# Patient Record
Sex: Female | Born: 1995 | Race: Black or African American | Hispanic: No | Marital: Single | Smoking: Current every day smoker
Health system: Southern US, Community
[De-identification: ages and names within clinical notes are randomized; demographics above are authoritative.]

## PROBLEM LIST (undated history)

## (undated) DIAGNOSIS — F419 Anxiety disorder, unspecified: Secondary | ICD-10-CM

## (undated) DIAGNOSIS — F329 Major depressive disorder, single episode, unspecified: Secondary | ICD-10-CM

## (undated) DIAGNOSIS — F32A Depression, unspecified: Secondary | ICD-10-CM

## (undated) DIAGNOSIS — G43909 Migraine, unspecified, not intractable, without status migrainosus: Secondary | ICD-10-CM

## (undated) HISTORY — PX: TONSILLECTOMY: SUR1361

---
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
## 2015-01-07 ENCOUNTER — Ambulatory Visit (INDEPENDENT_AMBULATORY_CARE_PROVIDER_SITE_OTHER): Payer: Medicaid Other | Admitting: Family Medicine

## 2015-01-07 VITALS — BP 116/50 | HR 87 | Temp 98.5°F | Wt 275.8 lb

## 2015-01-07 DIAGNOSIS — Z3046 Encounter for surveillance of implantable subdermal contraceptive: Secondary | ICD-10-CM

## 2015-01-07 DIAGNOSIS — Z308 Encounter for other contraceptive management: Secondary | ICD-10-CM

## 2015-01-07 NOTE — Patient Instructions (Signed)
The effects of the nexplanon should wear off in a week or so. Please use condoms for any sexual activity starting today. Let us know if you need another type of birth control.  Your arm will be sore for a few days. You can take tylenol for this. Let us know if it starts to look infected.

## 2015-01-07 NOTE — Progress Notes (Signed)
Patient ID: Gloria Proctor, female   DOB: 06-07-1996, 19 y.o.   MRN: [id number] Patient is here for removal of her etonogestrel rod implant (Nexplanon). She would like it removed because of weight gain. Denies S/E to the implant.  An informed consent was taken prior to removal.  Risks of the procedure include: bleeding, infection, difficulty with removal, scarring and nerve damage. There may be bruising at the site of incision and down the arm.  Procedure Note:  Time out taken: 10:50am  Team: Dr Richarda BladeAdamo, Dr Lum BabeEniola, Dorisann Framesamika Martin  Name and DOB of patient confirmed.  Procedure: Progestin Implant Removal  Procedure confirmed by patient and team.  Side: right arm  Position correct for procedure.  The patient is place in the supine position. Asceptic conditions are maintained. The rod is located by palpation. The area is cleaned with antiseptic. 3 cc of 2% lidocaine without epinephrine is injected just underneath the end of the implant closest to the elbow. After firmly pressing down on the end of the implant closer to the axilla a 2-3 mm incision is made with a scalpel. The rod is pushed to the incision site and grasped with a mosquito forceps and gently removed. Blunt dissection was not needed.  The patient toleratee the procedure well. The 4 cm rod was removed in its entirety. The incision was dressed with a small adhesive bandage closure and a pressure dressing was applied. An alternate plan for contraception was discussed. The patient would like to use Condom for her contraception. She was given a detailed instruction sheet about her desired method of contraception.  Procedure performed by Dr Richarda BladeAdamo, supervised by Dr Lum BabeEniola.

## 2015-03-30 ENCOUNTER — Ambulatory Visit (INDEPENDENT_AMBULATORY_CARE_PROVIDER_SITE_OTHER): Payer: Medicaid Other | Admitting: Family Medicine

## 2015-03-30 VITALS — BP 125/74 | HR 96 | Temp 98.5°F | Ht 63.0 in | Wt 283.0 lb

## 2015-03-30 DIAGNOSIS — L02439 Carbuncle of limb, unspecified: Secondary | ICD-10-CM

## 2015-03-30 DIAGNOSIS — L732 Hidradenitis suppurativa: Secondary | ICD-10-CM

## 2015-03-30 MED ORDER — DOXYCYCLINE HYCLATE 100 MG PO CAPS: 100.0000 mg | ORAL_CAPSULE | Freq: Two times a day (BID) | ORAL | Status: DC

## 2015-03-30 NOTE — Progress Notes (Signed)
Patient ID: Gloria Proctor, female   DOB: 1996-02-29, 19 y.o.   MRN: [id number]   [facility] Yolande Jolly, MD Phone: [phone]  Subjective:   # Boils - Pt. Has a hx of hydradenitis with recent boils under her right arm. She was seen previously and placed on doxycycline for 10 days which she finished 2 weeks ago.  - She says that the boils were gone at the end of her 2 week period, but returned last week. - One has drained, but another continues to be tender and bothersome.  - She has not had fever, chills, nausea, vomiting, spreading erythema under her arm.  - The area was more tender last week but is improving on its own.  - She is avoiding occlusive clothing, and she has been trying to avoid deodorant, but has been using gel deodorant recently due to running out of spray deodorant.  - She has not been using warm compresses, just warm showers twice daily.  - She has not been on any other medications including antibiotics or pain medicine since her last course of antibiotics.    All relevant systems were reviewed and were negative unless otherwise noted in the HPI  Past Medical History Reviewed problem list.  Medications- reviewed and updated Current Outpatient Prescriptions  Medication Sig Dispense Refill  . acetaminophen (TYLENOL) 325 MG tablet Take 650 mg by mouth every 6 (six) hours as needed (for pain.).    Marland Kitchen ALPRAZolam (XANAX) 0.5 MG tablet Take 0.5 mg by mouth at bedtime as needed for anxiety.    Marland Kitchen aspirin EC 81 MG tablet Take 81 mg by mouth once.    . cyclobenzaprine (FLEXERIL) 10 MG tablet Take 1 tablet (10 mg total) by mouth 2 (two) times daily as needed for muscle spasms. 20 tablet 0  . doxycycline (VIBRAMYCIN) 100 MG capsule Take 1 capsule (100 mg total) by mouth 2 (two) times daily. 20 capsule 0  . etonogestrel (NEXPLANON) 68 MG IMPL implant 1 each by Subdermal route once.    Marland Kitchen HYDROcodone-acetaminophen (NORCO/VICODIN) 5-325 MG per tablet  Take 1 tablet by mouth every 6 (six) hours as needed for moderate pain. (Patient not taking: Reported on 09/08/2014) 15 tablet 0  . ibuprofen (ADVIL,MOTRIN) 800 MG tablet Take 1 tablet (800 mg total) by mouth 3 (three) times daily. 21 tablet 0  . ipratropium (ATROVENT) 0.06 % nasal spray Place 2 sprays into both nostrils 4 (four) times daily.    Marland Kitchen sulfamethoxazole-trimethoprim (BACTRIM DS,SEPTRA DS) 800-160 MG per tablet Take 1 tablet by mouth 2 (two) times daily. 20 tablet 0  . SUMAtriptan (IMITREX) 5 MG/ACT nasal spray Place 1 spray into the nose every 2 (two) hours as needed for migraine.     No current facility-administered medications for this visit.   Chief complaint-noted No additions to family history Social history- patient is a non smoker  Objective: BP 125/74 mmHg  Pulse 96  Temp(Src) 98.5 F (36.9 C) (Oral)  Ht 5\' 3"  (1.6 m)  Wt 283 lb (128.368 kg)  BMI 50.14 kg/m2 Gen: NAD, alert, cooperative with exam HEENT: NCAT, EOMI, PERRL Neck: FROM, supple CV: RRR, good S1/S2, no murmur Resp: CTABL, no wheezes, non-labored Abd: SNTND, BS present, no guarding or organomegaly Ext: No edema, warm, normal tone, moves UE/LE spontaneously Neuro: Alert and oriented, No gross deficits Skin: She has several carbuncles / faruncles under her right arm. One has drained already, one other is mildly tender, but does not feel  large enough to drain. Three separate palpable carbuncles. No erythema, or signs of spreading infection. No fluctuance, mostly hard / slightly tender areas.   Assessment/Plan:  # Carbuncles - Pt. With a small area of fluid collection / inflammation / infection under her right arm. Not quite large enough to I&D. No evidence of systemic infection at this time.   - Recommend repeat course of Doxycycline as this worked previously.  - Warm compresses to the area q 1-2 hours  - Pt. To return if she develops worsening redness or pain. May require I&D.  - No deodorant. Only spray  deodorant. Avoid occlusive clothing.  - Follow up with PCP regarding carbuncles.

## 2015-03-30 NOTE — Patient Instructions (Signed)
Abscess An abscess is an infected area that contains a collection of pus and debris.It can occur in almost any part of the body. An abscess is also known as a furuncle or boil. CAUSES  An abscess occurs when tissue gets infected. This can occur from blockage of oil or sweat glands, infection of hair follicles, or a minor injury to the skin. As the body tries to fight the infection, pus collects in the area and creates pressure under the skin. This pressure causes pain. People with weakened immune systems have difficulty fighting infections and get certain abscesses more often.  SYMPTOMS Usually an abscess develops on the skin and becomes a painful mass that is red, warm, and tender. If the abscess forms under the skin, you may feel a moveable soft area under the skin. Some abscesses break open (rupture) on their own, but most will continue to get worse without care. The infection can spread deeper into the body and eventually into the bloodstream, causing you to feel ill.  DIAGNOSIS  Your caregiver will take your medical history and perform a physical exam. A sample of fluid may also be taken from the abscess to determine what is causing your infection. TREATMENT  Your caregiver may prescribe antibiotic medicines to fight the infection. However, taking antibiotics alone usually does not cure an abscess. Your caregiver may need to make a small cut (incision) in the abscess to drain the pus. In some cases, gauze is packed into the abscess to reduce pain and to continue draining the area. HOME CARE INSTRUCTIONS   Only take over-the-counter or prescription medicines for pain, discomfort, or fever as directed by your caregiver.  If you were prescribed antibiotics, take them as directed. Finish them even if you start to feel better.  If gauze is used, follow your caregiver's directions for changing the gauze.  To avoid spreading the infection:  Keep your draining abscess covered with a  bandage.  Wash your hands well.  Do not share personal care items, towels, or whirlpools with others.  Avoid skin contact with others.  Keep your skin and clothes clean around the abscess.  Keep all follow-up appointments as directed by your caregiver. SEEK MEDICAL CARE IF:   You have increased pain, swelling, redness, fluid drainage, or bleeding.  You have muscle aches, chills, or a general ill feeling.  You have a fever. MAKE SURE YOU:   Understand these instructions.  Will watch your condition.  Will get help right away if you are not doing well or get worse. Document Released: 05/24/2005 Document Revised: 02/13/2012 Document Reviewed: 10/27/2011 [facility] Patient Information 2015 [place], [place]. This information is not intended to replace advice given to you by your health care provider. Make sure you discuss any questions you have with your health care provider.    Thanks for coming in today.   Place warm compresses under your arm every 1-2 hours for around 10 minutes. This will help to relieve the abscess.   Take ibuprofen over the counter for pain. I have sent in a script for Doxycycline. You will take one pill twice daily for 10 days.   Only use the spray deodorant, and do not wear shirts that cover up your arm pit / are restrictive. Sleeveless shirts will be best.   Follow up with your primary doctor about your periods and about the boils. If you have any worsening symptoms, fever, chills, nausea, vomiting, or worsening tenderness / redness then please return for further evaluation as  it might need to be opened up.   Thanks for letting us take care of you!  Sincerely,  Devota Pace, MD Family Medicine - PGY 2

## 2015-04-20 ENCOUNTER — Encounter: Payer: Self-pay | Admitting: Family Medicine

## 2015-04-20 ENCOUNTER — Ambulatory Visit (INDEPENDENT_AMBULATORY_CARE_PROVIDER_SITE_OTHER): Payer: Medicaid Other | Admitting: Family Medicine

## 2015-04-20 VITALS — BP 128/77 | HR 80 | Temp 98.2°F | Ht 63.0 in | Wt 285.2 lb

## 2015-04-20 DIAGNOSIS — L732 Hidradenitis suppurativa: Secondary | ICD-10-CM

## 2015-04-20 NOTE — Assessment & Plan Note (Signed)
States she has never heard this diagnosis before.  Discussed with her.  Treat today with Doxycycline for concerns of surrounding area of cellulitis.  These have self-expressed.  FU as needed.  Handout on hidradenitis given.

## 2015-04-20 NOTE — Patient Instructions (Signed)
Let's try the Doxycycline again to help with this.    Take it twice a day for the next 10 days.    Let me know if you have any problems with this.   Hidradenitis Suppurativa, Sweat Gland Abscess Hidradenitis suppurativa is a long lasting (chronic), uncommon disease of the sweat glands. With this, boil-like lumps and scarring develop in the groin, some times under the arms (axillae), and under the breasts. It may also uncommonly occur behind the ears, in the crease of the buttocks, and around the genitals.  CAUSES  The cause is from a blocking of the sweat glands. They then become infected. It may cause drainage and odor. It is not contagious. So it cannot be given to someone else. It most often shows up in puberty (about 77 to 19 years of age). But it may happen much later. It is similar to acne which is a disease of the sweat glands. This condition is slightly more common in African-Americans and women. SYMPTOMS   Hidradenitis usually starts as one or more red, tender, swellings in the groin or under the arms (axilla).  Over a period of hours to days the lesions get larger. They often open to the skin surface, draining clear to yellow-colored fluid.  The infected area heals with scarring. DIAGNOSIS  Your caregiver makes this diagnosis by looking at you. Sometimes cultures (growing germs on plates in the lab) may be taken. This is to see what germ (bacterium) is causing the infection.  TREATMENT   Topical germ killing medicine applied to the skin (antibiotics) are the treatment of choice. Antibiotics taken by mouth (systemic) are sometimes needed when the condition is getting worse or is severe.  Avoid tight-fitting clothing which traps moisture in.  Dirt does not cause hidradenitis and it is not caused by poor hygiene.  Involved areas should be cleaned daily using an antibacterial soap. Some patients find that the liquid form of Lever 2000, applied to the involved areas as a lotion after  bathing, can help reduce the odor related to this condition.  Sometimes surgery is needed to drain infected areas or remove scarred tissue. Removal of large amounts of tissue is used only in severe cases.  Birth control pills may be helpful.  Oral retinoids (vitamin A derivatives) for 6 to 12 months which are effective for acne may also help this condition.  Weight loss will improve but not cure hidradenitis. It is made worse by being overweight. But the condition is not caused by being overweight.  This condition is more common in people who have had acne.  It may become worse under stress. There is no medical cure for hidradenitis. It can be controlled, but not cured. The condition usually continues for years with periods of getting worse and getting better (remission). Document Released: 03/28/2004 Document Revised: 11/06/2011 Document Reviewed: 11/14/2013 [facility] Patient Information 2015 [place], [place]. This information is not intended to replace advice given to you by your health care provider. Make sure you discuss any questions you have with your health care provider.

## 2015-04-20 NOTE — Progress Notes (Signed)
Subjective:    Gloria Proctor is a 19 y.o. female who presents to [facility] today for boils:  1.  Boils:  Has had several boils appear on bilateral breasts in past month or so.  Has had prior abscesses in axilla and groin.  Never breast.  She is worried that it is b/c her breasts are too big.  They have all currently expressed themselves.  No fevers or chills.  No N/V.  Eating and drinking well.   ROS as above per HPI, otherwise neg.    The following portions of the patient's history were reviewed and updated as appropriate: allergies, current medications, past medical history, family and social history, and problem list. Patient is a nonsmoker.    PMH reviewed.  Past Medical History  Diagnosis Date  . Migraines    Past Surgical History  Procedure Laterality Date  . Tonsillectomy and adenoidectomy N/A 08/16/2013    Procedure: TONSILLECTOMY AND ADENOIDECTOMY;  Surgeon: Flo Shanks, MD;  Location: [facility];  Service: ENT;  Laterality: N/A;    Medications reviewed. Current Outpatient Prescriptions  Medication Sig Dispense Refill  . acetaminophen (TYLENOL) 325 MG tablet Take 650 mg by mouth every 6 (six) hours as needed (for pain.).    Marland Kitchen ALPRAZolam (XANAX) 0.5 MG tablet Take 0.5 mg by mouth at bedtime as needed for anxiety.    Marland Kitchen aspirin EC 81 MG tablet Take 81 mg by mouth once.    . cyclobenzaprine (FLEXERIL) 10 MG tablet Take 1 tablet (10 mg total) by mouth 2 (two) times daily as needed for muscle spasms. 20 tablet 0  . doxycycline (VIBRAMYCIN) 100 MG capsule Take 1 capsule (100 mg total) by mouth 2 (two) times daily. 20 capsule 0  . etonogestrel (NEXPLANON) 68 MG IMPL implant 1 each by Subdermal route once.    Marland Kitchen HYDROcodone-acetaminophen (NORCO/VICODIN) 5-325 MG per tablet Take 1 tablet by mouth every 6 (six) hours as needed for moderate pain. (Patient not taking: Reported on 09/08/2014) 15 tablet 0  . ibuprofen (ADVIL,MOTRIN) 800 MG tablet Take 1 tablet (800 mg total) by mouth 3 (three)  times daily. 21 tablet 0  . ipratropium (ATROVENT) 0.06 % nasal spray Place 2 sprays into both nostrils 4 (four) times daily.    Marland Kitchen sulfamethoxazole-trimethoprim (BACTRIM DS,SEPTRA DS) 800-160 MG per tablet Take 1 tablet by mouth 2 (two) times daily. 20 tablet 0  . SUMAtriptan (IMITREX) 5 MG/ACT nasal spray Place 1 spray into the nose every 2 (two) hours as needed for migraine.     No current facility-administered medications for this visit.     Objective:   Physical Exam BP 128/77 mmHg  Pulse 80  Temp(Src) 98.2 F (36.8 C) (Oral)  Ht  (1.6 m)  Wt 285 lb 3.2 oz (129.366 kg)  BMI 50.53 kg/m2 Gen:  Alert, cooperative patient who appears stated age in no acute distress.  Vital signs reviewed. Skin:  3 expressed abscess on Right breast, 2 on left.  No surrounding areas of erythema on Right, does have erythema on both left boils.  No induration.  No fluctuance.   No results found for this or any previous visit (from the past 72 hour(s)).

## 2015-05-17 NOTE — Pre-Procedure Instructions (Signed)
BMI > 50, will need to be done at [facility]; Sam at Dr. Randa Evens office notified.

## 2015-05-18 ENCOUNTER — Encounter: Payer: Self-pay | Admitting: Family Medicine

## 2015-05-18 ENCOUNTER — Encounter (HOSPITAL_BASED_OUTPATIENT_CLINIC_OR_DEPARTMENT_OTHER): Payer: Self-pay

## 2015-05-18 ENCOUNTER — Ambulatory Visit (HOSPITAL_BASED_OUTPATIENT_CLINIC_OR_DEPARTMENT_OTHER): Admit: 2015-05-18 | Payer: Medicaid Other | Admitting: Oral Surgery

## 2015-05-18 ENCOUNTER — Ambulatory Visit (INDEPENDENT_AMBULATORY_CARE_PROVIDER_SITE_OTHER): Payer: Medicaid Other | Admitting: Family Medicine

## 2015-05-18 VITALS — BP 140/84 | HR 72 | Temp 98.1°F | Wt 283.0 lb

## 2015-05-18 DIAGNOSIS — R109 Unspecified abdominal pain: Secondary | ICD-10-CM | POA: Diagnosis not present

## 2015-05-18 LAB — POCT URINE PREGNANCY: Preg Test, Ur: NEGATIVE

## 2015-05-18 SURGERY — EXTRACTION, TOOTH, MOLAR
Anesthesia: General | Site: Mouth

## 2015-05-18 NOTE — Progress Notes (Signed)
  HPI:  Pt presents for a same day appointment to discuss needing work note.  Patient called out of work yesterday due to having stomach pains, headache, and feeling fatigued. Now feels much better and is ready to go back to work. No vomiting or fever. Headache resolved. Has had abdominal pain about every other day for the last week. Occasionally has had diarrhea but no blood in stool. No vaginal discharge or pelvic pain. Sexually active with one female partner in the last year. Abdominal pain is not worsening. LMP was 2 years ago, recently got nexplanon out in May due to weight gain. Does not use condoms regularly. Pain is located in epigastric area. Cramping-like in quality.  ROS: See HPI  PMFSH: history of anx/depressed mood, hidradenitis suppuritiva, migraine, obesity  PHYSICAL EXAM: BP 140/84 mmHg  Pulse 72  Temp(Src) 98.1 F (36.7 C) (Oral)  Wt 283 lb (128.368 kg) Gen: NAD, pleasant, cooperative HEENT: NCAT Heart: regular rate and rhythm, no murmur Lungs: clear to auscultation bilaterally, NWOB Abdomen: soft, nontender to palpation. No masses or organomegaly Neuro: grossly nonfocal, speech normal  ASSESSMENT/PLAN:  1. Abdominal pain - occuring every other day for the past week, with some diarrhea. Benign abdominal exam today. Urine pregnancy test negative. May represent mild viral gastroenteritis vs gastritis. No red flags. Recommend trial of OTC tums or zantac, observation. Return if not improving. Note given saying she can return to work.  2. Contraception management - encouraged patient to consider additional forms of birth control since no longer has nexplanon. Gave handout on [facility] options.  FOLLOW UP: F/u as needed if symptoms worsen or do not improve.   [place] J. Pollie Meyer, MD [facility]

## 2015-05-18 NOTE — Patient Instructions (Signed)
Give this a little more time to see if it gets better Okay to try over the counter tums or zantac  Return if not improving See handout below on birth control options  Be well, Dr. Pollie Meyer   Contraception Choices Contraception (birth control) is the use of any methods or devices to prevent pregnancy. Below are some methods to help avoid pregnancy. HORMONAL METHODS   Contraceptive implant. This is a thin, plastic tube containing progesterone hormone. It does not contain estrogen hormone. Your health care Maja Mccaffery inserts the tube in the inner part of the upper arm. The tube can remain in place for up to 3 years. After 3 years, the implant must be removed. The implant prevents the ovaries from releasing an egg (ovulation), thickens the cervical mucus to prevent sperm from entering the uterus, and thins the lining of the inside of the uterus.  Progesterone-only injections. These injections are given every 3 months by your health care Maicey Barrientez to prevent pregnancy. This synthetic progesterone hormone stops the ovaries from releasing eggs. It also thickens cervical mucus and changes the uterine lining. This makes it harder for sperm to survive in the uterus.  Birth control pills. These pills contain estrogen and progesterone hormone. They work by preventing the ovaries from releasing eggs (ovulation). They also cause the cervical mucus to thicken, preventing the sperm from entering the uterus. Birth control pills are prescribed by a health care Sergei Delo.Birth control pills can also be used to treat heavy periods.  Minipill. This type of birth control pill contains only the progesterone hormone. They are taken every day of each month and must be prescribed by your health care Aveen Stansel.  Birth control patch. The patch contains hormones similar to those in birth control pills. It must be changed once a week and is prescribed by a health care Lonnetta Kniskern.  Vaginal ring. The ring contains hormones similar  to those in birth control pills. It is left in the vagina for 3 weeks, removed for 1 week, and then a new one is put back in place. The patient must be comfortable inserting and removing the ring from the vagina.A health care Shena Vinluan's prescription is necessary.  Emergency contraception. Emergency contraceptives prevent pregnancy after unprotected sexual intercourse. This pill can be taken right after sex or up to 5 days after unprotected sex. It is most effective the sooner you take the pills after having sexual intercourse. Most emergency contraceptive pills are available without a prescription. Check with your pharmacist. Do not use emergency contraception as your only form of birth control. BARRIER METHODS   Female condom. This is a thin sheath (latex or rubber) that is worn over the penis during sexual intercourse. It can be used with spermicide to increase effectiveness.  Female condom. This is a soft, loose-fitting sheath that is put into the vagina before sexual intercourse.  Diaphragm. This is a soft, latex, dome-shaped barrier that must be fitted by a health care Dorea Duff. It is inserted into the vagina, along with a spermicidal jelly. It is inserted before intercourse. The diaphragm should be left in the vagina for 6 to 8 hours after intercourse.  Cervical cap. This is a round, soft, latex or plastic cup that fits over the cervix and must be fitted by a health care Delaine Canter. The cap can be left in place for up to 48 hours after intercourse.  Sponge. This is a soft, circular piece of polyurethane foam. The sponge has spermicide in it. It is inserted into the vagina  after wetting it and before sexual intercourse.  Spermicides. These are chemicals that kill or block sperm from entering the cervix and uterus. They come in the form of creams, jellies, suppositories, foam, or tablets. They do not require a prescription. They are inserted into the vagina with an applicator before having sexual  intercourse. The process must be repeated every time you have sexual intercourse. INTRAUTERINE CONTRACEPTION  Intrauterine device (IUD). This is a T-shaped device that is put in a woman's uterus during a menstrual period to prevent pregnancy. There are 2 types:  Copper IUD. This type of IUD is wrapped in copper wire and is placed inside the uterus. Copper makes the uterus and fallopian tubes produce a fluid that kills sperm. It can stay in place for 10 years.  Hormone IUD. This type of IUD contains the hormone progestin (synthetic progesterone). The hormone thickens the cervical mucus and prevents sperm from entering the uterus, and it also thins the uterine lining to prevent implantation of a fertilized egg. The hormone can weaken or kill the sperm that get into the uterus. It can stay in place for 3-5 years, depending on which type of IUD is used. PERMANENT METHODS OF CONTRACEPTION  Female tubal ligation. This is when the woman's fallopian tubes are surgically sealed, tied, or blocked to prevent the egg from traveling to the uterus.  Hysteroscopic sterilization. This involves placing a small coil or insert into each fallopian tube. Your doctor uses a technique called hysteroscopy to do the procedure. The device causes scar tissue to form. This results in permanent blockage of the fallopian tubes, so the sperm cannot fertilize the egg. It takes about 3 months after the procedure for the tubes to become blocked. You must use another form of birth control for these 3 months.  Female sterilization. This is when the female has the tubes that carry sperm tied off (vasectomy).This blocks sperm from entering the vagina during sexual intercourse. After the procedure, the man can still ejaculate fluid (semen). NATURAL PLANNING METHODS  Natural family planning. This is not having sexual intercourse or using a barrier method (condom, diaphragm, cervical cap) on days the woman could become pregnant.  Calendar  method. This is keeping track of the length of each menstrual cycle and identifying when you are fertile.  Ovulation method. This is avoiding sexual intercourse during ovulation.  Symptothermal method. This is avoiding sexual intercourse during ovulation, using a thermometer and ovulation symptoms.  Post-ovulation method. This is timing sexual intercourse after you have ovulated. Regardless of which type or method of contraception you choose, it is important that you use condoms to protect against the transmission of sexually transmitted infections (STIs). Talk with your health care [employer] about which form of contraception is most appropriate for you. Document Released: 08/14/2005 Document Revised: 08/19/2013 Document Reviewed: 02/06/2013 [facility] Patient Information 2015 [place], [place]. This information is not intended to replace advice given to you by your health care Trayon Krantz. Make sure you discuss any questions you have with your health care Mandie Crabbe.

## 2015-05-27 ENCOUNTER — Encounter: Payer: Self-pay | Admitting: Family Medicine

## 2015-05-27 ENCOUNTER — Ambulatory Visit (INDEPENDENT_AMBULATORY_CARE_PROVIDER_SITE_OTHER): Payer: Medicaid Other | Admitting: Family Medicine

## 2015-05-27 VITALS — BP 129/81 | HR 100 | Temp 98.6°F | Ht 63.0 in | Wt 284.0 lb

## 2015-05-27 DIAGNOSIS — G43809 Other migraine, not intractable, without status migrainosus: Secondary | ICD-10-CM

## 2015-05-27 MED ORDER — IBUPROFEN 600 MG PO TABS: ORAL_TABLET | ORAL | Status: DC

## 2015-05-27 NOTE — Patient Instructions (Signed)
Thank you for coming to see me today. It was a pleasure. Today we talked about:   Migraine: I will discontinue your Imitrex since you do not like this medication. I will prescribe Ibuprofen  for you to take during migraines. Do not take this for more than 3 days. Do not take this if you become pregnant.  Please make an appointment to see Dr. Caroleen Hamman for follow-up  If you have any questions or concerns, please do not hesitate to call the office at [phone].  Sincerely,  Jacquelin Hawking, MD  Migraine Headache A migraine headache is an intense, throbbing pain on one or both sides of your head. A migraine can last for 30 minutes to several hours. CAUSES  The exact cause of a migraine headache is not always known. However, a migraine may be caused when nerves in the brain become irritated and release chemicals that cause inflammation. This causes pain. Certain things may also trigger migraines, such as:  Alcohol.  Smoking.  Stress.  Menstruation.  Aged cheeses.  Foods or drinks that contain nitrates, glutamate, aspartame, or tyramine.  Lack of sleep.  Chocolate.  Caffeine.  Hunger.  Physical exertion.  Fatigue.  Medicines used to treat chest pain (nitroglycerine), birth control pills, estrogen, and some blood pressure medicines. SIGNS AND SYMPTOMS  Pain on one or both sides of your head.  Pulsating or throbbing pain.  Severe pain that prevents daily activities.  Pain that is aggravated by any physical activity.  Nausea, vomiting, or both.  Dizziness.  Pain with exposure to bright lights, loud noises, or activity.  General sensitivity to bright lights, loud noises, or smells. Before you get a migraine, you may get warning signs that a migraine is coming (aura). An aura may include:  Seeing flashing lights.  Seeing bright spots, halos, or zigzag lines.  Having tunnel vision or blurred vision.  Having feelings of numbness or tingling.  Having  trouble talking.  Having muscle weakness. DIAGNOSIS  A migraine headache is often diagnosed based on:  Symptoms.  Physical exam.  A CT scan or MRI of your head. These imaging tests cannot diagnose migraines, but they can help rule out other causes of headaches. TREATMENT Medicines may be given for pain and nausea. Medicines can also be given to help prevent recurrent migraines.  HOME CARE INSTRUCTIONS  Only take over-the-counter or prescription medicines for pain or discomfort as directed by your health care provider. The use of long-term narcotics is not recommended.  Lie down in a dark, quiet room when you have a migraine.  Keep a journal to find out what may trigger your migraine headaches. For example, write down:  What you eat and drink.  How much sleep you get.  Any change to your diet or medicines.  Limit alcohol consumption.  Quit smoking if you smoke.  Get 7-9 hours of sleep, or as recommended by your health care provider.  Limit stress.  Keep lights dim if bright lights bother you and make your migraines worse. SEEK IMMEDIATE MEDICAL CARE IF:   Your migraine becomes severe.  You have a fever.  You have a stiff neck.  You have vision loss.  You have muscular weakness or loss of muscle control.  You start losing your balance or have trouble walking.  You feel faint or pass out.  You have severe symptoms that are different from your first symptoms. MAKE SURE YOU:   Understand these instructions.  Will watch your condition.  Will get  help right away if you are not doing well or get worse. Document Released: 08/14/2005 Document Revised: 12/29/2013 Document Reviewed: 04/21/2013 [facility] Patient Information 2015 [place]. This information is not intended to replace advice given to you by your health care provider. Make sure you discuss any questions you have with your health care provider.

## 2015-05-27 NOTE — Progress Notes (Signed)
    Subjective   Gloria Proctor is a 19 y.o. female that presents for a same day visit  1. Migraines: Patient states she had a bad migraine yesterday. She had to take Imitrex which helps, but makes her nauseated and causes her to vomit. These symptoms usually resolve quickly with no further issues. She did not use any ibuprofen for her headache. She is having migraines very infrequently.   ROS Per HPI  Social History  Substance Use Topics  . Smoking status: Former Smoker    Types: Cigarettes    Start date: 03/28/2014    Quit date: 02/18/2015  . Smokeless tobacco: Current User  . Alcohol Use: No    Allergies  Allergen Reactions  . Caffeine Other (See Comments)    Patient has migraines   . Chocolate Other (See Comments)    Reaction:migraines    Objective   BP 129/81 mmHg  Pulse 100  Temp(Src) 98.6 F (37 C) (Oral)  Ht  (1.6 m)  Wt 284 lb (128.822 kg)  BMI 50.32 kg/m2  General: Well appearing, no distress  Assessment and Plan   Meds ordered this encounter  Medications  . ibuprofen (ADVIL,MOTRIN) 600 MG tablet    Sig: Take 1 pill every 3 hours as needed for migraine for up to 3 days    Dispense:  30 tablet    Refill:  0    Migraine: currently asymptomatic after using Imitrex. Patient having very infrequent symptoms. Does not like side effects of Imitrex  Ibuprofen  q8hrs prn for headache/migraine  Follow-up with PCP  Note for work

## 2015-06-01 ENCOUNTER — Encounter (HOSPITAL_COMMUNITY): Payer: Self-pay

## 2015-06-01 ENCOUNTER — Encounter (HOSPITAL_COMMUNITY)
Admission: RE | Admit: 2015-06-01 | Discharge: 2015-06-01 | Disposition: A | Discharge status: Home / Self Care | Payer: Medicaid Other | Source: Ambulatory Visit | Attending: Oral Surgery | Admitting: Oral Surgery

## 2015-06-01 DIAGNOSIS — Z01812 Encounter for preprocedural laboratory examination: Secondary | ICD-10-CM | POA: Diagnosis present

## 2015-06-01 HISTORY — DX: Depression, unspecified: F32.A

## 2015-06-01 HISTORY — DX: Major depressive disorder, single episode, unspecified: F32.9

## 2015-06-01 HISTORY — DX: Anxiety disorder, unspecified: F41.9

## 2015-06-01 LAB — CBC
HEMATOCRIT: 39.2 % (ref 36.0–46.0)
Hemoglobin: 13 g/dL (ref 12.0–15.0)
MCH: 28.3 pg (ref 26.0–34.0)
MCHC: 33.2 g/dL (ref 30.0–36.0)
MCV: 85.2 fL (ref 78.0–100.0)
PLATELETS: 339 10*3/uL (ref 150–400)
RBC: 4.6 MIL/uL (ref 3.87–5.11)
RDW: 13.4 % (ref 11.5–15.5)
WBC: 6.4 10*3/uL (ref 4.0–10.5)

## 2015-06-01 LAB — HCG, SERUM, QUALITATIVE: Preg, Serum: NEGATIVE

## 2015-06-01 NOTE — Progress Notes (Signed)
PCP - [facility] Cardiologist - denies  EKG- denies CXR- denies  Echo/stress test/cardiac cath - pt. Denies  Patient denies chest pain and shortness of breath at PAT appointment.

## 2015-06-01 NOTE — Pre-Procedure Instructions (Signed)
    KATIEANN HUNGATE  06/01/2015      [id number] [place], [place] - [address] AT [facility] [address] [place] [address] Phone: [phone] Fax: [phone]    Your procedure is scheduled on Friday, October 7th, 2016.Marland Kitchen  Report to [facility] Admitting at 8:30 A.M.  Call this number if you have problems the morning of surgery:  [phone]   Remember:  Do not eat food or drink liquids after midnight.   Take these medicines the morning of surgery with A SIP OF WATER: None  Stop taking: Ibuprofen, NSAIDS, Aspirin, Aleve, Naproxen, Advil, Motrin, all herbal medications, and all vitamins.    Do not wear jewelry, make-up or nail polish.  Do not wear lotions, powders, or perfumes.  You may wear deodorant.  Do not shave 48 hours prior to surgery.    Do not bring valuables to the hospital.  [facility] is not responsible for any belongings or valuables.  Contacts, dentures or bridgework may not be worn into surgery.  Leave your suitcase in the car.  After surgery it may be brought to your room.  For patients admitted to the hospital, discharge time will be determined by your treatment team.  Patients discharged the day of surgery will not be allowed to drive home.   Special instructions:  See attached.   Please read over the following fact sheets that you were given. Pain Booklet, Coughing and Deep Breathing and Surgical Site Infection Prevention

## 2015-06-02 NOTE — H&P (Signed)
HISTORY AND PHYSICAL  Gloria Proctor is a 19 y.o. female patient with CC: painful wisdom teeth.  No diagnosis found.  Past Medical History  Diagnosis Date  . Migraines   . Anxiety   . Depression     No current facility-administered medications for this encounter.   Current Outpatient Prescriptions  Medication Sig Dispense Refill  . acetaminophen (TYLENOL) 325 MG tablet Take 650 mg by mouth every 6 (six) hours as needed (for pain.).    Marland Kitchen ALPRAZolam (XANAX) 0.5 MG tablet Take 0.5 mg by mouth at bedtime as needed for anxiety.    Marland Kitchen aspirin EC 81 MG tablet Take 81 mg by mouth once.    . fluticasone (FLONASE) 50 MCG/ACT nasal spray Place 1 spray into both nostrils daily.    Marland Kitchen ibuprofen (ADVIL,MOTRIN) 600 MG tablet Take 1 pill every 3 hours as needed for migraine for up to 3 days 30 tablet 0  . ipratropium (ATROVENT) 0.06 % nasal spray Place 2 sprays into both nostrils 4 (four) times daily.    . cyclobenzaprine (FLEXERIL) 10 MG tablet Take 1 tablet (10 mg total) by mouth 2 (two) times daily as needed for muscle spasms. (Patient not taking: Reported on 05/31/2015) 20 tablet 0  . doxycycline (VIBRAMYCIN) 100 MG capsule Take 1 capsule (100 mg total) by mouth 2 (two) times daily. (Patient not taking: Reported on 05/31/2015) 20 capsule 0  . sulfamethoxazole-trimethoprim (BACTRIM DS,SEPTRA DS) 800-160 MG per tablet Take 1 tablet by mouth 2 (two) times daily. (Patient not taking: Reported on 05/31/2015) 20 tablet 0   Allergies  Allergen Reactions  . Caffeine Other (See Comments)    Patient has migraines   . Chocolate Other (See Comments)    Reaction:migraines   Active Problems:   * No active hospital problems. *  Vitals: There were no vitals taken for this visit. Lab results: Results for orders placed or performed during the hospital encounter of 06/01/15 (from the past 24 hour(s))  hCG, serum, qualitative     Status: None   Collection Time: 06/01/15 12:41 PM  Result Value Ref Range   Preg, Serum NEGATIVE NEGATIVE  CBC     Status: None   Collection Time: 06/01/15 12:41 PM  Result Value Ref Range   WBC 6.4 4.0 - 10.5 K/uL   RBC 4.60 3.87 - 5.11 MIL/uL   Hemoglobin 13.0 12.0 - 15.0 g/dL   HCT 16.1 09.6 - 04.5 %   MCV 85.2 78.0 - 100.0 fL   MCH 28.3 26.0 - 34.0 pg   MCHC 33.2 30.0 - 36.0 g/dL   RDW 40.9 81.1 - 91.4 %   Platelets 339 150 - 400 K/uL   Radiology Results: No results found. General appearance: alert, cooperative, no distress and morbidly obese Head: Normocephalic, without obvious abnormality, atraumatic Eyes: negative Nose: Nares normal. Septum midline. Mucosa normal. No drainage or sinus tenderness. Throat: lips, mucosa, and tongue normal; teeth and gums normal and Impacted teeth # 1, 16, 17, 32. Pharynx clear. No trismus. Neck: no adenopathy, supple, symmetrical, trachea midline and thyroid not enlarged, symmetric, no tenderness/mass/nodules Resp: clear to auscultation bilaterally Cardio: regular rate and rhythm, S1, S2 normal, no murmur, click, rub or gallop  Assessment:Impacted teeth 1, 16, 17, 32  Plan: Dental extractions impacted wisdom teeth. General anesthesia. Day surgery.   Bronda Alfred M 06/02/2015

## 2015-06-03 MED ORDER — DEXTROSE 5 % IV SOLN
3.0000 g | INTRAVENOUS | Status: AC
  Administered 2015-06-04: 3 g via INTRAVENOUS
  Filled 2015-06-03: qty 3000

## 2015-06-04 ENCOUNTER — Ambulatory Visit (HOSPITAL_COMMUNITY): Payer: Medicaid Other | Admitting: Certified Registered Nurse Anesthetist

## 2015-06-04 ENCOUNTER — Ambulatory Visit (HOSPITAL_COMMUNITY)
Admission: RE | Admit: 2015-06-04 | Discharge: 2015-06-04 | Disposition: A | Discharge status: Home / Self Care | Payer: Medicaid Other | Source: Ambulatory Visit | Attending: Oral Surgery | Admitting: Oral Surgery

## 2015-06-04 ENCOUNTER — Encounter (HOSPITAL_COMMUNITY)
Admission: RE | Disposition: A | Discharge status: Home / Self Care | Payer: Self-pay | Source: Ambulatory Visit | Attending: Oral Surgery

## 2015-06-04 ENCOUNTER — Encounter (HOSPITAL_COMMUNITY): Payer: Self-pay | Admitting: *Deleted

## 2015-06-04 DIAGNOSIS — F419 Anxiety disorder, unspecified: Secondary | ICD-10-CM | POA: Diagnosis not present

## 2015-06-04 DIAGNOSIS — Z888 Allergy status to other drugs, medicaments and biological substances status: Secondary | ICD-10-CM | POA: Diagnosis not present

## 2015-06-04 DIAGNOSIS — Z87891 Personal history of nicotine dependence: Secondary | ICD-10-CM | POA: Insufficient documentation

## 2015-06-04 DIAGNOSIS — Z6841 Body Mass Index (BMI) 40.0 and over, adult: Secondary | ICD-10-CM | POA: Diagnosis not present

## 2015-06-04 DIAGNOSIS — Z7982 Long term (current) use of aspirin: Secondary | ICD-10-CM | POA: Diagnosis not present

## 2015-06-04 DIAGNOSIS — G43909 Migraine, unspecified, not intractable, without status migrainosus: Secondary | ICD-10-CM | POA: Insufficient documentation

## 2015-06-04 DIAGNOSIS — K011 Impacted teeth: Secondary | ICD-10-CM | POA: Insufficient documentation

## 2015-06-04 DIAGNOSIS — Z91018 Allergy to other foods: Secondary | ICD-10-CM | POA: Insufficient documentation

## 2015-06-04 DIAGNOSIS — F329 Major depressive disorder, single episode, unspecified: Secondary | ICD-10-CM | POA: Diagnosis not present

## 2015-06-04 HISTORY — PX: MULTIPLE EXTRACTIONS WITH ALVEOLOPLASTY: SHX5342

## 2015-06-04 SURGERY — MULTIPLE EXTRACTION WITH ALVEOLOPLASTY
Anesthesia: General | Site: Mouth

## 2015-06-04 MED ORDER — LIDOCAINE HCL (CARDIAC) 20 MG/ML IV SOLN
INTRAVENOUS | Status: DC | PRN
  Administered 2015-06-04: 100 mg via INTRAVENOUS

## 2015-06-04 MED ORDER — MIDAZOLAM HCL 5 MG/5ML IJ SOLN
INTRAMUSCULAR | Status: DC | PRN
  Administered 2015-06-04: 2 mg via INTRAVENOUS

## 2015-06-04 MED ORDER — ONDANSETRON HCL 4 MG/2ML IJ SOLN
INTRAMUSCULAR | Status: DC | PRN
  Administered 2015-06-04: 4 mg via INTRAVENOUS

## 2015-06-04 MED ORDER — FENTANYL CITRATE (PF) 100 MCG/2ML IJ SOLN
INTRAMUSCULAR | Status: DC | PRN
  Administered 2015-06-04: 150 ug via INTRAVENOUS
  Administered 2015-06-04: 50 ug via INTRAVENOUS

## 2015-06-04 MED ORDER — PROPOFOL 10 MG/ML IV BOLUS
INTRAVENOUS | Status: DC | PRN
  Administered 2015-06-04: 125 mg via INTRAVENOUS
  Administered 2015-06-04: 35 mg via INTRAVENOUS

## 2015-06-04 MED ORDER — PROPOFOL 10 MG/ML IV BOLUS
INTRAVENOUS | Status: AC
Start: 2015-06-04 — End: 2015-06-04
  Filled 2015-06-04: qty 20

## 2015-06-04 MED ORDER — SUCCINYLCHOLINE CHLORIDE 20 MG/ML IJ SOLN
INTRAMUSCULAR | Status: AC
  Filled 2015-06-04: qty 1

## 2015-06-04 MED ORDER — OXYCODONE-ACETAMINOPHEN 5-325 MG PO TABS: 1.0000 | ORAL_TABLET | ORAL | Status: DC | PRN

## 2015-06-04 MED ORDER — 0.9 % SODIUM CHLORIDE (POUR BTL) OPTIME
TOPICAL | Status: DC | PRN
  Administered 2015-06-04: 1000 mL

## 2015-06-04 MED ORDER — LIDOCAINE HCL (CARDIAC) 20 MG/ML IV SOLN
INTRAVENOUS | Status: AC
  Filled 2015-06-04: qty 5

## 2015-06-04 MED ORDER — ONDANSETRON HCL 4 MG/2ML IJ SOLN
INTRAMUSCULAR | Status: AC
  Filled 2015-06-04: qty 2

## 2015-06-04 MED ORDER — FENTANYL CITRATE (PF) 250 MCG/5ML IJ SOLN
INTRAMUSCULAR | Status: AC
  Filled 2015-06-04: qty 5

## 2015-06-04 MED ORDER — OXYMETAZOLINE HCL 0.05 % NA SOLN
NASAL | Status: DC | PRN
  Administered 2015-06-04: 1 via NASAL

## 2015-06-04 MED ORDER — SODIUM CHLORIDE 0.9 % IR SOLN
Status: DC | PRN
  Administered 2015-06-04: 1

## 2015-06-04 MED ORDER — LACTATED RINGERS IV SOLN
INTRAVENOUS | Status: DC
  Administered 2015-06-04: 09:00:00 via INTRAVENOUS

## 2015-06-04 MED ORDER — OXYMETAZOLINE HCL 0.05 % NA SOLN
NASAL | Status: AC
  Filled 2015-06-04: qty 15

## 2015-06-04 MED ORDER — LIDOCAINE-EPINEPHRINE 2 %-1:100000 IJ SOLN
INTRAMUSCULAR | Status: DC | PRN
  Administered 2015-06-04: 13 mL via INTRADERMAL

## 2015-06-04 MED ORDER — LIDOCAINE-EPINEPHRINE 2 %-1:100000 IJ SOLN
INTRAMUSCULAR | Status: AC
  Filled 2015-06-04: qty 1

## 2015-06-04 MED ORDER — LACTATED RINGERS IV SOLN: INTRAVENOUS | Status: DC | PRN

## 2015-06-04 MED ORDER — MIDAZOLAM HCL 2 MG/2ML IJ SOLN
INTRAMUSCULAR | Status: AC
  Filled 2015-06-04: qty 4

## 2015-06-04 SURGICAL SUPPLY — 26 items
BUR CROSS CUT FISSURE 1.6 (BURR) ×2 IMPLANT
BUR EGG ELITE 4.0 (BURR) IMPLANT
CANISTER SUCTION 2500CC (MISCELLANEOUS) ×2 IMPLANT
COVER SURGICAL LIGHT HANDLE (MISCELLANEOUS) ×2 IMPLANT
CRADLE DONUT ADULT HEAD (MISCELLANEOUS) ×2 IMPLANT
FLUID NSS /IRRIG 1000 ML XXX (MISCELLANEOUS) ×2 IMPLANT
GAUZE PACKING FOLDED 2  STR (GAUZE/BANDAGES/DRESSINGS) ×1
GAUZE PACKING FOLDED 2 STR (GAUZE/BANDAGES/DRESSINGS) ×1 IMPLANT
GLOVE BIO SURGEON STRL SZ 6.5 (GLOVE) ×2 IMPLANT
GLOVE BIO SURGEON STRL SZ7.5 (GLOVE) ×2 IMPLANT
GLOVE BIOGEL PI IND STRL 7.0 (GLOVE) ×1 IMPLANT
GLOVE BIOGEL PI INDICATOR 7.0 (GLOVE) ×1
GOWN STRL REUS W/ TWL LRG LVL3 (GOWN DISPOSABLE) ×1 IMPLANT
GOWN STRL REUS W/ TWL XL LVL3 (GOWN DISPOSABLE) ×1 IMPLANT
GOWN STRL REUS W/TWL LRG LVL3 (GOWN DISPOSABLE) ×2
GOWN STRL REUS W/TWL XL LVL3 (GOWN DISPOSABLE) ×2
KIT BASIN OR (CUSTOM PROCEDURE TRAY) ×2 IMPLANT
KIT ROOM TURNOVER OR (KITS) ×2 IMPLANT
NEEDLE 22X1 1/2 (OR ONLY) (NEEDLE) ×4 IMPLANT
NS IRRIG 1000ML POUR BTL (IV SOLUTION) ×2 IMPLANT
PAD ARMBOARD 7.5X6 YLW CONV (MISCELLANEOUS) ×2 IMPLANT
SUT CHROMIC 3 0 PS 2 (SUTURE) ×3 IMPLANT
SYR CONTROL 10ML LL (SYRINGE) ×2 IMPLANT
TRAY ENT MC OR (CUSTOM PROCEDURE TRAY) ×2 IMPLANT
TUBING IRRIGATION (MISCELLANEOUS) ×2 IMPLANT
YANKAUER SUCT BULB TIP NO VENT (SUCTIONS) ×2 IMPLANT

## 2015-06-04 NOTE — Transfer of Care (Signed)
Immediate Anesthesia Transfer of Care Note  Patient: Gloria Proctor  Procedure(s) Performed: Procedure(s): REMOVAL OF WISDOM TEETH (N/A)  Patient Location: PACU  Anesthesia Type:General  Level of Consciousness: awake, patient cooperative and responds to stimulation  Airway & Oxygen Therapy: Patient Spontanous Breathing and Patient connected to face mask oxygen  Post-op Assessment: Report given to RN and Post -op Vital signs reviewed and stable  Post vital signs: Reviewed and stable  Last Vitals:  Filed Vitals:   06/04/15 0809  BP: 127/74  Pulse: 72  Temp: 36.2 C  Resp: 20    Complications: No apparent anesthesia complications

## 2015-06-04 NOTE — H&P (Signed)
H&P documentation  -History and Physical Reviewed  -Patient has been re-examined  -No change in the plan of care  Gloria Proctor M  

## 2015-06-04 NOTE — Op Note (Signed)
06/04/2015  10:35 AM  PATIENT:  Gloria Proctor  19 y.o. female  PRE-OPERATIVE DIAGNOSIS:  morbid obesity; Impacted teeth # 1, 16, 17, 32  POST-OPERATIVE DIAGNOSIS:  SAME  PROCEDURE:  Procedure(s): REMOVAL OF WISDOM TEETH  # 1, 16, 17, 32  SURGEON:  Surgeon(s): Ocie Doyne, DDS  ANESTHESIA:   local and general  EBL:  minimal  DRAINS: none   SPECIMEN:  No Specimen  COUNTS:  YES  PLAN OF CARE: Discharge to home after PACU  PATIENT DISPOSITION:  PACU - hemodynamically stable.   PROCEDURE DETAILS: Dictation # [id number]  Gloria Proctor, DMD 06/04/2015 10:35 AM

## 2015-06-04 NOTE — Anesthesia Preprocedure Evaluation (Addendum)
Anesthesia Evaluation  Patient identified by MRN, date of birth, ID band Patient awake    Reviewed: Allergy & Precautions, NPO status , Patient's Chart, lab work & pertinent test results  Airway Mallampati: I  TM Distance: >3 FB Neck ROM: Full    Dental  (+) Dental Advisory Given   Pulmonary former smoker,    Pulmonary exam normal        Cardiovascular Normal cardiovascular exam     Neuro/Psych  Headaches, PSYCHIATRIC DISORDERS Anxiety Depression    GI/Hepatic   Endo/Other  Morbid obesity  Renal/GU      Musculoskeletal   Abdominal   Peds  Hematology   Anesthesia Other Findings   Reproductive/Obstetrics                            Anesthesia Physical Anesthesia Plan  ASA: II  Anesthesia Plan: General   Post-op Pain Management:    Induction: Intravenous  Airway Management Planned: Nasal ETT  Additional Equipment:   Intra-op Plan:   Post-operative Plan: Extubation in OR  Informed Consent: I have reviewed the patients History and Physical, chart, labs and discussed the procedure including the risks, benefits and alternatives for the proposed anesthesia with the patient or authorized representative who has indicated his/her understanding and acceptance.   Dental advisory given  Plan Discussed with: CRNA and Surgeon  Anesthesia Plan Comments:        Anesthesia Quick Evaluation

## 2015-06-04 NOTE — Anesthesia Procedure Notes (Signed)
Procedure Name: Intubation Date/Time: 06/04/2015 10:15 AM Performed by: Faustino Congress Tiran Sauseda Pre-anesthesia Checklist: Patient identified, Emergency Drugs available, Suction available and Patient being monitored Patient Re-evaluated:Patient Re-evaluated prior to inductionOxygen Delivery Method: Circle system utilized Preoxygenation: Pre-oxygenation with 100% oxygen Intubation Type: IV induction Ventilation: Mask ventilation without difficulty Laryngoscope Size: Mac and 3 Grade View: Grade I Nasal Tubes: Left, Nasal prep performed and Magill forceps- large, utilized Tube size: 7.0 mm Number of attempts: 1 Placement Confirmation: ETT inserted through vocal cords under direct vision,  positive ETCO2 and breath sounds checked- equal and bilateral Tube secured with: Tape Dental Injury: Teeth and Oropharynx as per pre-operative assessment

## 2015-06-04 NOTE — Anesthesia Postprocedure Evaluation (Signed)
Anesthesia Post Note  Patient: Gloria Proctor  Procedure(s) Performed: Procedure(s) (LRB): REMOVAL OF WISDOM TEETH (N/A)  Anesthesia type: general  Patient location: PACU  Post pain: Pain level controlled  Post assessment: Patient's Cardiovascular Status Stable  Last Vitals:  Filed Vitals:   06/04/15 1120  BP: 110/63  Pulse: 101  Temp: 36.7 C  Resp: 20    Post vital signs: Reviewed and stable  Level of consciousness: sedated  Complications: No apparent anesthesia complications

## 2015-06-05 NOTE — Op Note (Signed)
NAMETELESA, Gloria NO.:  [id number]  MEDICAL RECORD NO.:  [id number]  LOCATION:  [facility]                         FACILITY:  [facility]  PHYSICIAN:  Georgia Lopes, M.D.  DATE OF BIRTH:  02-08-1996  DATE OF PROCEDURE:  06/04/2015 DATE OF DISCHARGE:  06/04/2015                              OPERATIVE REPORT   PREOPERATIVE DIAGNOSES:  Impacted teeth #1, 16, 17, and 32.  Morbid obesity.  POSTOPERATIVE DIAGNOSES:  Impacted teeth #1, 16, 17, and 32.  Morbid obesity.  PROCEDURE:  Removal of wisdom teeth #1, 16, 17, and 32.  SURGEON:  Georgia Lopes, M.D.  ANESTHESIA:  General, nasal intubation, Dr. Michelle Piper attending.  DESCRIPTION OF PROCEDURE:  The patient was taken to the operating room and placed on the table in supine position, general anesthesia was administered intravenously and nasal endotracheal tube was placed and secured.  The eyes were protected and the patient was draped for the procedure.  Time-out was performed.  The posterior pharynx was suctioned and the throat pack was placed.  A 2% lidocaine with 1:100,000 epinephrine was infiltrated in an inferior alveolar block on the right and left side, and buccal and palatal infiltration around the posterior maxilla where the wisdom teeth were.  Bite block was placed in the right side of the mouth and a sweetheart retractor was used to retract the tongue.  A #15 blade was used to make an incision overlying teeth #16 and 17 with a 15-blade.  Then, the periosteal elevator was used to reflect the periosteal tissues.  Bone was removed from around teeth #16 and 17 using a Stryker handpiece under irrigation.  Tooth #17 was sectioned with the handpiece and removed in pieces.  Tooth #16 was removed using a 301 elevator.  The sockets were then curetted, irrigated and closed with 3-0 chromic.  The bite block and sweetheart retractor were repositioned to the other side of the mouth to allow access of teeth #1 and 32.  A  #15-blade was used to make an incision overlying these teeth and then the periosteum was reflected using a periosteal elevator.  Bone was removed using a Stryker handpiece around tooth #32 and around tooth #1.  Then, the 301 elevator was used to elevate teeth #1 and 32.  The sockets were then curetted after removal of the teeth and the areas were irrigated and closed with 3-0 chromic.  The oral cavity was then irrigated and suctioned.  Throat pack was removed.  The patient was awakened, taken to the recovery room, breathing spontaneously in good condition.  EBL:  Minimum.  COMPLICATIONS:  None.  SPECIMENS:  None.     Georgia Lopes, M.D.     SMJ/MEDQ  D:  06/04/2015  T:  06/05/2015  Job:  [id number]

## 2015-06-07 ENCOUNTER — Encounter (HOSPITAL_COMMUNITY): Payer: Self-pay | Admitting: Oral Surgery

## 2015-06-24 ENCOUNTER — Encounter (HOSPITAL_COMMUNITY): Payer: Self-pay | Admitting: *Deleted

## 2015-06-24 ENCOUNTER — Emergency Department (INDEPENDENT_AMBULATORY_CARE_PROVIDER_SITE_OTHER)
Admission: EM | Admit: 2015-06-24 | Discharge: 2015-06-24 | Disposition: A | Discharge status: Home / Self Care | Payer: Medicaid Other | Source: Home / Self Care | Attending: Family Medicine | Admitting: Family Medicine

## 2015-06-24 DIAGNOSIS — K529 Noninfective gastroenteritis and colitis, unspecified: Secondary | ICD-10-CM

## 2015-06-24 LAB — POCT PREGNANCY, URINE: Preg Test, Ur: NEGATIVE

## 2015-06-24 MED ORDER — ONDANSETRON 4 MG PO TBDP
4.0000 mg | ORAL_TABLET | Freq: Once | ORAL | Status: AC
  Administered 2015-06-24: 4 mg via ORAL

## 2015-06-24 MED ORDER — ONDANSETRON 4 MG PO TBDP
ORAL_TABLET | ORAL | Status: AC
  Filled 2015-06-24: qty 1

## 2015-06-24 MED ORDER — ONDANSETRON HCL 4 MG PO TABS: 4.0000 mg | ORAL_TABLET | Freq: Four times a day (QID) | ORAL | Status: DC

## 2015-06-24 NOTE — ED Notes (Signed)
Pt  Reports     Symptoms  Of  Vomiting  X  2  Today   With some  Diarrhea   And abd  Cramping  As   Well  -  Pt   reorts  Symptoms  Started  sev  Days  Ago     Pt is  Not    Actively  Vomiting  At this  Time          She  Is  Sitting upright on  Exam table  Speaking in  Complete  sentances

## 2015-06-24 NOTE — Discharge Instructions (Signed)
Clear liquid , bland diet tonight as tolerated, advance on fri as improved, use medicine as needed, return or see your doctor if any problems.  °

## 2015-06-24 NOTE — ED Provider Notes (Signed)
CSN: [id number]     Arrival date & time 06/24/15  1629 History   First MD Initiated Contact with Patient 06/24/15 1748     Chief Complaint  Patient presents with  . Emesis   (Consider location/radiation/quality/duration/timing/severity/associated sxs/prior Treatment) Patient is a 19 y.o. female presenting with vomiting. The history is provided by the patient.  Emesis Severity:  Mild Duration:  2 days Quality:  Stomach contents Progression:  Unchanged (last emesis was this am, no blood seen.) Chronicity:  New Relieved by:  None tried Worsened by:  Nothing tried Ineffective treatments:  None tried Associated symptoms: diarrhea   Associated symptoms: no abdominal pain and no fever   Risk factors: suspect food intake   Risk factors: not pregnant now     Past Medical History  Diagnosis Date  . Migraines   . Anxiety   . Depression    Past Surgical History  Procedure Laterality Date  . Tonsillectomy and adenoidectomy N/A 08/16/2013    Procedure: TONSILLECTOMY AND ADENOIDECTOMY;  Surgeon: Flo Shanks, MD;  Location: [facility];  Service: ENT;  Laterality: N/A;  . Multiple extractions with alveoloplasty N/A 06/04/2015    Procedure: REMOVAL OF WISDOM TEETH;  Surgeon: Ocie Doyne, DDS;  Location: [facility];  Service: Oral Surgery;  Laterality: N/A;   Family History  Problem Relation Age of Onset  . Migraines Mother     Started having migraines during adulthood, after child birth  . Heart attack Paternal Grandfather    Social History  Substance Use Topics  . Smoking status: Former Smoker    Types: Cigarettes    Start date: 03/28/2014    Quit date: 02/18/2015  . Smokeless tobacco: Current User  . Alcohol Use: No   OB History    No data available     Review of Systems  Constitutional: Negative.   Gastrointestinal: Positive for nausea, vomiting and diarrhea. Negative for abdominal pain, blood in stool, abdominal distention and anal bleeding.  Genitourinary: Negative.   All  other systems reviewed and are negative.   Allergies  Caffeine and Chocolate  Home Medications   Prior to Admission medications   Medication Sig Start Date End Date Taking? Authorizing Provider  ALPRAZolam Prudy Feeler) 0.5 MG tablet Take 0.5 mg by mouth at bedtime as needed for anxiety.    Historical Provider, MD  cyclobenzaprine (FLEXERIL) 10 MG tablet Take 1 tablet (10 mg total) by mouth 2 (two) times daily as needed for muscle spasms. Patient not taking: Reported on 05/31/2015 09/08/14   Roxy Horseman, PA-C  doxycycline (VIBRAMYCIN) 100 MG capsule Take 1 capsule (100 mg total) by mouth 2 (two) times daily. Patient not taking: Reported on 05/31/2015 03/30/15   Yolande Jolly, MD  fluticasone (FLONASE) 50 MCG/ACT nasal spray Place 1 spray into both nostrils daily.    Historical Provider, MD  ibuprofen (ADVIL,MOTRIN) 600 MG tablet Take 1 pill every 3 hours as needed for migraine for up to 3 days 05/27/15   Narda Bonds, MD  ipratropium (ATROVENT) 0.06 % nasal spray Place 2 sprays into both nostrils 4 (four) times daily.    Historical Provider, MD  ondansetron (ZOFRAN) 4 MG tablet Take 1 tablet (4 mg total) by mouth every 6 (six) hours. Prn n/v. 06/24/15   Linna Hoff, MD  oxyCODONE-acetaminophen (PERCOCET) 5-325 MG tablet Take 1-2 tablets by mouth every 4 (four) hours as needed for severe pain. 06/04/15   Ocie Doyne, DDS  sulfamethoxazole-trimethoprim (BACTRIM DS,SEPTRA DS) 800-160 MG per tablet Take 1  tablet by mouth 2 (two) times daily. Patient not taking: Reported on 05/31/2015 12/21/14   Abram SanderElena M Adamo, MD   Meds Ordered and Administered this Visit   Medications  ondansetron (ZOFRAN-ODT) disintegrating tablet 4 mg (4 mg Oral Given 06/24/15 1823)    BP 133/82 mmHg  Pulse 66  Temp(Src) 98.6 F (37 C) (Oral)  Resp 16  SpO2 98% No data found.   Physical Exam  Constitutional: She is oriented to person, place, and time. She appears well-developed and well-nourished. No distress.   HENT:  Mouth/Throat: Oropharynx is clear and moist.  Abdominal: Soft. Bowel sounds are normal. She exhibits no distension and no mass. There is no tenderness. There is no rebound and no guarding.  Neurological: She is alert and oriented to person, place, and time.  Skin: Skin is warm and dry.  Nursing note and vitals reviewed.   ED Course  Procedures (including critical care time)  Labs Review Labs Reviewed  POCT PREGNANCY, URINE    Imaging Review No results found.   Visual Acuity Review  Right Eye Distance:   Left Eye Distance:   Bilateral Distance:    Right Eye Near:   Left Eye Near:    Bilateral Near:         MDM   1. Gastroenteritis, acute    Tolerated po challenge prior to d/c. rx for zofran.   Linna HoffJames D Ovie Eastep, MD 06/24/15 [phone]

## 2015-07-13 ENCOUNTER — Ambulatory Visit: Payer: Medicaid Other | Admitting: Family Medicine

## 2015-07-16 ENCOUNTER — Ambulatory Visit (INDEPENDENT_AMBULATORY_CARE_PROVIDER_SITE_OTHER): Payer: Medicaid Other | Admitting: Family Medicine

## 2015-07-16 VITALS — BP 126/79 | HR 87 | Temp 98.4°F | Wt 282.0 lb

## 2015-07-16 DIAGNOSIS — N912 Amenorrhea, unspecified: Secondary | ICD-10-CM | POA: Diagnosis present

## 2015-07-16 DIAGNOSIS — L853 Xerosis cutis: Secondary | ICD-10-CM | POA: Diagnosis not present

## 2015-07-16 LAB — POCT URINE PREGNANCY: PREG TEST UR: NEGATIVE

## 2015-07-16 MED ORDER — MEDROXYPROGESTERONE ACETATE 5 MG PO TABS: 5.0000 mg | ORAL_TABLET | Freq: Every day | ORAL | Status: DC

## 2015-07-16 NOTE — Patient Instructions (Signed)
Thank you so much for coming to visit today! You may try Vaseline or Eucerin cream on your face to help with the dryness. Please avoid washing your face excessively, however you can wash it 1-2 times a day. We will give you a short course of Provera for seven days...you should start your period shortly after you have completed this course. If you do not start your period within 1-2 weeks after you have completed the medication, please let me know. Please remember that you may become pregnant at any time since you are not on birth control. If you would like to discuss birth control further, please schedule an appointment.  Thanks again! Dr. Caroleen Hammanumley

## 2015-07-19 DIAGNOSIS — N912 Amenorrhea, unspecified: Secondary | ICD-10-CM | POA: Insufficient documentation

## 2015-07-19 DIAGNOSIS — L853 Xerosis cutis: Secondary | ICD-10-CM | POA: Insufficient documentation

## 2015-07-19 NOTE — Assessment & Plan Note (Signed)
-   Along nasolabial folds. Possibly due to resolving contact dermatitis secondary to new skin lotion, no signs of inflammation noted. - Discontinue skin lotion. Recommend vaseline or Eucerin - Discussed hygiene - Continue to monitor

## 2015-07-19 NOTE — Assessment & Plan Note (Signed)
-   Discontinuation of Nexplanon in 12/2014 without return of menstrual cycle - Pregnancy test negative - Provera 5mg  x7 days. Hopeful for resumption of menstrual cycle after discontinuation. - Monitor cycle following Provera - Counseled on birth control. Not currently on any method. Declines at this time.

## 2015-07-19 NOTE — Progress Notes (Signed)
Subjective:     Patient ID: Gloria Proctor, female   DOB: 1995/11/16, 19 y.o.   MRN: [id number]  HPI Gloria Proctor is a 19yo female presenting today for dry skin on the face and amenorrhea.  # Dry Skin: - Located on face, in nasolabial folds bilaterally - Occasionally burns and itches - Denies rash elsewhere, redness - Started a new Rubie MaidMary Kay lotion just prior to rash occuring. Has been improving since discontinuation. - Has been present x2 weeks  # Amenorrhea - Nexplanon placed 07/2014. Removal of Nexplanon in 12/2014 due to weight gain.  - Has not had a menstrual cycle since removal - Reports negative pregnancy test a few weeks ago - Sexually active - Does not wish to start birth control at this visit. - Denies abdominal pain, vaginal discharge  Smoking status discussed.   Review of Systems Per HPI    Objective:   Physical Exam  Constitutional: She appears well-developed and well-nourished. No distress.  Cardiovascular: Normal rate and regular rhythm.  Exam reveals no gallop and no friction rub.   No murmur heard. Pulmonary/Chest: Effort normal. No respiratory distress. She has no wheezes. She has no rales.  Abdominal: Soft. Bowel sounds are normal. She exhibits no distension. There is no tenderness.  Musculoskeletal: She exhibits no edema.  Skin:  Dry skin noted along nasolabial folds without signs of rash or erythema  Psychiatric: She has a normal mood and affect. Her behavior is normal.       Assessment and Plan:     Dry skin - Along nasolabial folds. Possibly due to resolving contact dermatitis secondary to new skin lotion, no signs of inflammation noted. - Discontinue skin lotion. Recommend vaseline or Eucerin - Discussed hygiene - Continue to monitor  Amenorrhea - Discontinuation of Nexplanon in 12/2014 without return of menstrual cycle - Pregnancy test negative - Provera 5mg  x7 days. Hopeful for resumption of menstrual cycle after discontinuation. - Monitor  cycle following Provera - Counseled on birth control. Not currently on any method. Declines at this time.

## 2015-08-25 ENCOUNTER — Encounter: Payer: Self-pay | Admitting: Family Medicine

## 2015-08-25 ENCOUNTER — Ambulatory Visit (INDEPENDENT_AMBULATORY_CARE_PROVIDER_SITE_OTHER): Payer: Medicaid Other | Admitting: Family Medicine

## 2015-08-25 VITALS — BP 109/69 | HR 103 | Temp 98.3°F | Ht 63.0 in | Wt 292.4 lb

## 2015-08-25 DIAGNOSIS — L732 Hidradenitis suppurativa: Secondary | ICD-10-CM | POA: Diagnosis present

## 2015-08-25 DIAGNOSIS — L02439 Carbuncle of limb, unspecified: Secondary | ICD-10-CM | POA: Diagnosis not present

## 2015-08-25 DIAGNOSIS — L02419 Cutaneous abscess of limb, unspecified: Secondary | ICD-10-CM

## 2015-08-25 MED ORDER — DOXYCYCLINE HYCLATE 100 MG PO CAPS: 100.0000 mg | ORAL_CAPSULE | Freq: Two times a day (BID) | ORAL | Status: DC

## 2015-08-25 NOTE — Assessment & Plan Note (Signed)
Lesion already draining today, will not perform I&D. No signs of systemic infection or spreading cellulitis. Will give course of doxycycline. Will need follow up with surgery for ongoing management of hidradenitis. Return precautions reviewed.

## 2015-08-25 NOTE — Progress Notes (Signed)
    Subjective:  Gloria Proctor is a 19 y.o. female who [facility] appointment with a chief complaint of abscess.   HPI:  Left Axillary Abscess  Patient has a history of hidradenitis and presents with 1 week of an abscess in her left axilla. This is a recurrent problem for her. States that she normally gets a course of antibiotics and it improves. No fevers or chills. She has not tried any medications. Overall feels like it is getting better. Has not seen surgery yet.   ROS: Per HPI  Objective:  Physical Exam: BP 109/69 mmHg  Pulse 103  Temp(Src) 98.3 F (36.8 C) (Oral)  Ht 5\' 3"  (1.6 m)  Wt 292 lb 6.4 oz (132.632 kg)  BMI 51.81 kg/m2  Gen: NAD, resting comfortably Skin:  - Left Axilla: 2-3 cm area of induration in left axilla with central opening with purulent drainage present. No signs of spreading erythema. Nontender to palpation. No additional areas of fluctuance noted.  Neuro: grossly normal, moves all extremities Psych: Normal affect and thought content  Assessment/Plan:  Axillary abscess Lesion already draining today, will not perform I&D. No signs of systemic infection or spreading cellulitis. Will give course of doxycycline. Will need follow up with surgery for ongoing management of hidradenitis. Return precautions reviewed.   Katina Degreealeb M. Jimmey RalphParker, MD [facility] Resident PGY-2 08/25/2015 2:49 PM

## 2015-08-25 NOTE — Patient Instructions (Signed)
Abscess °An abscess (boil or furuncle) is an infected area on or under the skin. This area is filled with yellowish-white fluid (pus) and other material (debris). °HOME CARE  °· Only take medicines as told by your doctor. °· If you were given antibiotic medicine, take it as directed. Finish the medicine even if you start to feel better. °· If gauze is used, follow your doctor's directions for changing the gauze. °· To avoid spreading the infection: °¨ Keep your abscess covered with a bandage. °¨ Wash your hands well. °¨ Do not share personal care items, towels, or whirlpools with others. °¨ Avoid skin contact with others. °· Keep your skin and clothes clean around the abscess. °· Keep all doctor visits as told. °GET HELP RIGHT AWAY IF:  °· You have more pain, puffiness (swelling), or redness in the wound site. °· You have more fluid or blood coming from the wound site. °· You have muscle aches, chills, or you feel sick. °· You have a fever. °MAKE SURE YOU:  °· Understand these instructions. °· Will watch your condition. °· Will get help right away if you are not doing well or get worse. °  °This information is not intended to replace advice given to you by your health care provider. Make sure you discuss any questions you have with your health care provider. °  °Document Released: 01/31/2008 Document Revised: 02/13/2012 Document Reviewed: 10/28/2011 °Elsevier Interactive Patient Education ©2016 [employer] ° °

## 2015-09-07 ENCOUNTER — Encounter: Payer: Self-pay | Admitting: Family Medicine

## 2015-09-07 ENCOUNTER — Ambulatory Visit (INDEPENDENT_AMBULATORY_CARE_PROVIDER_SITE_OTHER): Payer: Medicaid Other | Admitting: Internal Medicine

## 2015-09-07 VITALS — BP 122/80 | HR 94 | Temp 98.6°F | Ht 63.0 in | Wt 290.0 lb

## 2015-09-07 DIAGNOSIS — N912 Amenorrhea, unspecified: Secondary | ICD-10-CM

## 2015-09-07 LAB — POCT URINE PREGNANCY: Preg Test, Ur: NEGATIVE

## 2015-09-07 NOTE — Patient Instructions (Addendum)
Your pregnancy test was negative. If you continue to have breast tenderness over the next couple of weeks, please come back for another test or for further evaluation.

## 2015-09-07 NOTE — Progress Notes (Signed)
   [facility] Phone: [phone]  Subjective:  Patient is here because she thinks she may be pregnant. She has been feeling "uncomfortable". She endorses weight gain. She has also had breast tenderness. She has taken a couple of pregnancy tests at home, all of which were negative. She wanted to have one done here because she "knows the pregnancy tests at home can be negative, even if people are pregnant". She has not had a period in 2 years. She was on Depo for a year and then had Nexplanon for a couple months. She stopped both of these because they caused her to gain weight. She has not used any birth control since May 2016. She last had sexual intercourse this morning. They do not use any condoms or other forms of birth control. She says she would be nervous but excited to be pregnant. She is not interested in any STD testing or contraceptive counseling today.   All other ROS were reviewed and are negative unless otherwise noted in the HPI. Past Medical History- migraines, obesity, herpes Reviewed problem list.  Medications- reviewed and updated Current Outpatient Prescriptions  Medication Sig Dispense Refill  . ALPRAZolam (XANAX) 0.5 MG tablet Take 0.5 mg by mouth at bedtime as needed for anxiety.    . cyclobenzaprine (FLEXERIL) 10 MG tablet Take 1 tablet (10 mg total) by mouth 2 (two) times daily as needed for muscle spasms. (Patient not taking: Reported on 05/31/2015) 20 tablet 0  . doxycycline (VIBRAMYCIN) 100 MG capsule Take 1 capsule (100 mg total) by mouth 2 (two) times daily. 20 capsule 0  . fluticasone (FLONASE) 50 MCG/ACT nasal spray Place 1 spray into both nostrils daily.    Marland Kitchen. ibuprofen (ADVIL,MOTRIN) 600 MG tablet Take 1 pill every 3 hours as needed for migraine for up to 3 days 30 tablet 0  . ipratropium (ATROVENT) 0.06 % nasal spray Place 2 sprays into both nostrils 4 (four) times daily.    . medroxyPROGESTERone (PROVERA) 5 MG tablet Take 1 tablet (5 mg  total) by mouth daily. 7 tablet 0  . ondansetron (ZOFRAN) 4 MG tablet Take 1 tablet (4 mg total) by mouth every 6 (six) hours. Prn n/v. 8 tablet 0  . oxyCODONE-acetaminophen (PERCOCET) 5-325 MG tablet Take 1-2 tablets by mouth every 4 (four) hours as needed for severe pain. 30 tablet 0   No current facility-administered medications for this visit.   Chief complaint-noted Family history reviewed for today's visit. No changes. Social history- patient is a former smoker  Objective: BP 122/80 mmHg  Pulse 94  Temp(Src) 98.6 F (37 C) (Oral)  Ht 5\' 3"  (1.6 m)  Wt 290 lb (131.543 kg)  BMI 51.38 kg/m2  SpO2 98%  LMP  Gen: NAD, alert, cooperative with exam HEENT: NCAT, EOMI, MMM Neck: FROM Resp: Normal work of breathing GI: SNTND, BS present, no guarding or organomegaly Neuro: Alert and oriented, no gross deficits Psych: Appropriate behavior  Assessment/Plan: Pt presents for pregnancy test, which was negative today. -Offered patient STD testing, she declined -Offered contraceptive counseling, she declined -Advised patient to return if she continues to have breast tenderness or other concerns that she might be pregnant. -Otherwise, follow-up with PCP as needed   Willadean CarolKaty Ayrabella Labombard, MD PGY-1

## 2015-09-24 ENCOUNTER — Other Ambulatory Visit: Payer: Self-pay | Admitting: *Deleted

## 2015-09-27 MED ORDER — FLUTICASONE PROPIONATE 50 MCG/ACT NA SUSP: 1.0000 | Freq: Every day | NASAL | Status: DC

## 2015-09-29 ENCOUNTER — Emergency Department (HOSPITAL_COMMUNITY)
Admission: EM | Admit: 2015-09-29 | Discharge: 2015-09-29 | Disposition: A | Discharge status: Home / Self Care | Payer: Medicaid Other | Attending: Emergency Medicine | Admitting: Emergency Medicine

## 2015-09-29 ENCOUNTER — Encounter (HOSPITAL_COMMUNITY): Payer: Self-pay | Admitting: Emergency Medicine

## 2015-09-29 DIAGNOSIS — Z792 Long term (current) use of antibiotics: Secondary | ICD-10-CM | POA: Diagnosis not present

## 2015-09-29 DIAGNOSIS — L02411 Cutaneous abscess of right axilla: Secondary | ICD-10-CM | POA: Insufficient documentation

## 2015-09-29 DIAGNOSIS — F17210 Nicotine dependence, cigarettes, uncomplicated: Secondary | ICD-10-CM | POA: Insufficient documentation

## 2015-09-29 DIAGNOSIS — Z23 Encounter for immunization: Secondary | ICD-10-CM | POA: Diagnosis not present

## 2015-09-29 DIAGNOSIS — Z7951 Long term (current) use of inhaled steroids: Secondary | ICD-10-CM | POA: Diagnosis not present

## 2015-09-29 DIAGNOSIS — G43909 Migraine, unspecified, not intractable, without status migrainosus: Secondary | ICD-10-CM | POA: Insufficient documentation

## 2015-09-29 DIAGNOSIS — Z793 Long term (current) use of hormonal contraceptives: Secondary | ICD-10-CM | POA: Diagnosis not present

## 2015-09-29 DIAGNOSIS — F329 Major depressive disorder, single episode, unspecified: Secondary | ICD-10-CM | POA: Diagnosis not present

## 2015-09-29 DIAGNOSIS — F419 Anxiety disorder, unspecified: Secondary | ICD-10-CM | POA: Diagnosis not present

## 2015-09-29 DIAGNOSIS — Z79899 Other long term (current) drug therapy: Secondary | ICD-10-CM | POA: Diagnosis not present

## 2015-09-29 MED ORDER — TRAMADOL HCL 50 MG PO TABS: 50.0000 mg | ORAL_TABLET | Freq: Four times a day (QID) | ORAL | Status: DC | PRN

## 2015-09-29 MED ORDER — HYDROCODONE-ACETAMINOPHEN 5-325 MG PO TABS
1.0000 | ORAL_TABLET | Freq: Once | ORAL | Status: AC
  Administered 2015-09-29: 1 via ORAL
  Filled 2015-09-29: qty 1

## 2015-09-29 MED ORDER — LIDOCAINE-EPINEPHRINE (PF) 2 %-1:200000 IJ SOLN
20.0000 mL | Freq: Once | INTRAMUSCULAR | Status: AC
  Administered 2015-09-29: 20 mL via INTRADERMAL
  Filled 2015-09-29: qty 20

## 2015-09-29 MED ORDER — CEPHALEXIN 500 MG PO CAPS: 1000.0000 mg | ORAL_CAPSULE | Freq: Two times a day (BID) | ORAL | Status: DC

## 2015-09-29 MED ORDER — TETANUS-DIPHTH-ACELL PERTUSSIS 5-2.5-18.5 LF-MCG/0.5 IM SUSP
0.5000 mL | Freq: Once | INTRAMUSCULAR | Status: AC
  Administered 2015-09-29: 0.5 mL via INTRAMUSCULAR
  Filled 2015-09-29: qty 0.5

## 2015-09-29 NOTE — Discharge Instructions (Signed)
Continue to apply warm compress to affected area several times daily to decrease infection.  Take antibiotics as prescribed.  Return in 48 hrs for wound recheck.  Abscess An abscess is an infected area that contains a collection of pus and debris.It can occur in almost any part of the body. An abscess is also known as a furuncle or boil. CAUSES  An abscess occurs when tissue gets infected. This can occur from blockage of oil or sweat glands, infection of hair follicles, or a minor injury to the skin. As the body tries to fight the infection, pus collects in the area and creates pressure under the skin. This pressure causes pain. People with weakened immune systems have difficulty fighting infections and get certain abscesses more often.  SYMPTOMS Usually an abscess develops on the skin and becomes a painful mass that is red, warm, and tender. If the abscess forms under the skin, you may feel a moveable soft area under the skin. Some abscesses break open (rupture) on their own, but most will continue to get worse without care. The infection can spread deeper into the body and eventually into the bloodstream, causing you to feel ill.  DIAGNOSIS  Your caregiver will take your medical history and perform a physical exam. A sample of fluid may also be taken from the abscess to determine what is causing your infection. TREATMENT  Your caregiver may prescribe antibiotic medicines to fight the infection. However, taking antibiotics alone usually does not cure an abscess. Your caregiver may need to make a small cut (incision) in the abscess to drain the pus. In some cases, gauze is packed into the abscess to reduce pain and to continue draining the area. HOME CARE INSTRUCTIONS   Only take over-the-counter or prescription medicines for pain, discomfort, or fever as directed by your caregiver.  If you were prescribed antibiotics, take them as directed. Finish them even if you start to feel better.  If gauze  is used, follow your caregiver's directions for changing the gauze.  To avoid spreading the infection:  Keep your draining abscess covered with a bandage.  Wash your hands well.  Do not share personal care items, towels, or whirlpools with others.  Avoid skin contact with others.  Keep your skin and clothes clean around the abscess.  Keep all follow-up appointments as directed by your caregiver. SEEK MEDICAL CARE IF:   You have increased pain, swelling, redness, fluid drainage, or bleeding.  You have muscle aches, chills, or a general ill feeling.  You have a fever. MAKE SURE YOU:   Understand these instructions.  Will watch your condition.  Will get help right away if you are not doing well or get worse.   This information is not intended to replace advice given to you by your health care provider. Make sure you discuss any questions you have with your health care provider.   Document Released: 05/24/2005 Document Revised: 02/13/2012 Document Reviewed: 10/27/2011 Elsevier Interactive Patient Education [employer]

## 2015-09-29 NOTE — ED Notes (Signed)
Patient states abscess under R arm that has been there for awhile but worsened in the last 2 days.   Denies other symptoms.

## 2015-09-29 NOTE — ED Provider Notes (Signed)
CSN: [id number]     Arrival date & time 09/29/15  1132 History  By signing my name below, I, Freida Busman, attest that this documentation has been prepared under the direction and in the presence of non-physician practitioner, Fayrene Helper, PA-C. Electronically Signed: Freida Busman, Scribe. 09/29/2015. 11:48 AM.    Chief Complaint  Patient presents with  . Abscess   The history is provided by the patient. No language interpreter was used.     HPI Comments:  Gloria Proctor is a 20 y.o. female who presents to the Emergency Department complaining of an "abscess" to right under arm x 2 days with moderate surrounding pain. Pain is sharp, throbbing persistent worsening with palpation and movement.  She reports h/o same requiring I& D. She denies fever. Tetanus status is unknown. No alleviating factors noted.  Past Medical History  Diagnosis Date  . Migraines   . Anxiety   . Depression    Past Surgical History  Procedure Laterality Date  . Tonsillectomy and adenoidectomy N/A 08/16/2013    Procedure: TONSILLECTOMY AND ADENOIDECTOMY;  Surgeon: Flo Shanks, MD;  Location: [facility];  Service: ENT;  Laterality: N/A;  . Multiple extractions with alveoloplasty N/A 06/04/2015    Procedure: REMOVAL OF WISDOM TEETH;  Surgeon: Ocie Doyne, DDS;  Location: [facility];  Service: Oral Surgery;  Laterality: N/A;   Family History  Problem Relation Age of Onset  . Migraines Mother     Started having migraines during adulthood, after child birth  . Heart attack Paternal Grandfather    Social History  Substance Use Topics  . Smoking status: Current Some Day Smoker    Types: Cigars    Start date: 03/28/2014  . Smokeless tobacco: Current User  . Alcohol Use: No   OB History    No data available     Review of Systems  Constitutional: Negative for fever.  Skin:       + Abscess right under arm    Allergies  Caffeine and Chocolate  Home Medications   Prior to Admission medications   Medication  Sig Start Date End Date Taking? Authorizing Provider  ALPRAZolam Prudy Feeler) 0.5 MG tablet Take 0.5 mg by mouth at bedtime as needed for anxiety.    Historical Provider, MD  cyclobenzaprine (FLEXERIL) 10 MG tablet Take 1 tablet (10 mg total) by mouth 2 (two) times daily as needed for muscle spasms. Patient not taking: Reported on 05/31/2015 09/08/14   Roxy Horseman, PA-C  doxycycline (VIBRAMYCIN) 100 MG capsule Take 1 capsule (100 mg total) by mouth 2 (two) times daily. 08/25/15   Ardith Dark, MD  fluticasone (FLONASE) 50 MCG/ACT nasal spray Place 1 spray into both nostrils daily. 09/27/15   [place] N Rumley, DO  ibuprofen (ADVIL,MOTRIN) 600 MG tablet Take 1 pill every 3 hours as needed for migraine for up to 3 days 05/27/15   Narda Bonds, MD  ipratropium (ATROVENT) 0.06 % nasal spray Place 2 sprays into both nostrils 4 (four) times daily.    Historical Provider, MD  medroxyPROGESTERone (PROVERA) 5 MG tablet Take 1 tablet (5 mg total) by mouth daily. 07/16/15   [place] N Rumley, DO  ondansetron (ZOFRAN) 4 MG tablet Take 1 tablet (4 mg total) by mouth every 6 (six) hours. Prn n/v. 06/24/15   Linna Hoff, MD  oxyCODONE-acetaminophen (PERCOCET) 5-325 MG tablet Take 1-2 tablets by mouth every 4 (four) hours as needed for severe pain. 06/04/15   Ocie Doyne, DDS   BP 164/72  mmHg  Pulse 89  Temp(Src) 98.4 F (36.9 C) (Oral)  Resp 16  SpO2 100% Physical Exam  Constitutional: She is oriented to person, place, and time. She appears well-developed and well-nourished. No distress.  HENT:  Head: Normocephalic and atraumatic.  Eyes: Conjunctivae are normal.  Cardiovascular: Normal rate.   Pulmonary/Chest: Effort normal.  Abdominal: She exhibits no distension.  Neurological: She is alert and oriented to person, place, and time.  Skin: Skin is warm and dry.  Right axillary fold: area of induration and fluctuance that is exquisitely tender to palpation. No overlying skin changes.   Psychiatric: She  has a normal mood and affect.  Nursing note and vitals reviewed.   ED Course  Procedures   DIAGNOSTIC STUDIES:  Oxygen Saturation is 100% on RA, normal by my interpretation.    COORDINATION OF CARE:  11:36 AM Will I&D abscess. Discussed treatment plan with pt at bedside and pt agreed to plan.  INCISION AND DRAINAGE PROCEDURE NOTE: Patient identification was confirmed and verbal consent was obtained. This procedure was performed by Fayrene Helper, PA-C at 11:46 AM. Site: R axillary region Sterile procedures observed Needle size: 25 gauge Anesthetic used (type and amt): lidocaine 2% with epi Blade size: 11 Drainage: moderate Complexity: Complex Packing used: 1/4 inch Site anesthetized, incision made over site, wound drained and explored loculations, rinsed with copious amounts of normal saline, wound packed with sterile gauze, covered with dry, sterile dressing.  Pt tolerated procedure well without complications.  Instructions for care discussed verbally and pt provided with additional written instructions for homecare and f/u.   MDM   Final diagnoses:  Cutaneous abscess of right axilla   BP 164/72 mmHg  Pulse 89  Temp(Src) 98.4 F (36.9 C) (Oral)  Resp 16  SpO2 100%   Patient with skin abscess. Incision and drainage performed in the ED today.   Wound recheck in 2 days. Supportive care and return precautions discussed.  Pt sent home with keflex/bactrim and pain medication.  F/u in 48 hrs for recheck.  Warm compress recommended. . The patient appears reasonably screened and/or stabilized for discharge and I doubt any other emergent medical condition requiring further screening, evaluation, or treatment in the ED prior to discharge.  I personally performed the services described in this documentation, which was scribed in my presence. The recorded information has been reviewed and is accurate.      Fayrene Helper, PA-C 09/29/15 1209  Vanetta Mulders, MD 10/01/15 2231

## 2015-09-29 NOTE — ED Notes (Signed)
See PA assessment 

## 2015-10-12 ENCOUNTER — Encounter: Payer: Self-pay | Admitting: Student

## 2015-10-12 ENCOUNTER — Ambulatory Visit (INDEPENDENT_AMBULATORY_CARE_PROVIDER_SITE_OTHER): Payer: Medicaid Other | Admitting: Student

## 2015-10-12 VITALS — BP 127/91 | HR 72 | Temp 98.1°F | Wt 292.3 lb

## 2015-10-12 DIAGNOSIS — R109 Unspecified abdominal pain: Secondary | ICD-10-CM

## 2015-10-12 NOTE — Assessment & Plan Note (Signed)
Cramping associated with menstrual cycle. Not impeding with normal function  - likely normal menstrual cramps  -discussed use of Motrin and heating pads for cramps as needed  - will follow up as needed -

## 2015-10-12 NOTE — Patient Instructions (Signed)
Follow-up as needed with PCP If you have further questions or concerns please call the office at [phone]

## 2015-10-12 NOTE — Progress Notes (Signed)
   Subjective:    Patient ID: Gloria Proctor, female    DOB: 1996/07/13, 20 y.o.   MRN: [id number]   CC: Abdominal cramping with menses  HPI: 20 year old female presenting for abdominal cramping with menses  Abdominal cramping - Cramping started on 2/9 when her menses started - This is the patient's first menses 2 years due to contraception - She states she forgot what menses felt like and was concerned with the cramping she was having and thought she need to be checked out - She has tried taking Motrin which helped her menstrual cramps but did not make them go away completely - Denies nausea, vomiting, diarrhea  -denies vaginal irritation, dysuria, foul-smelling discharge - Reports last sexual intercourse one month ago  - Patient does not want pelvic exam today  Review of Systems ROS  per history of present illness otherwise denies fevers, recent illnesses, Past Medical, Surgical, Social, and Family History Reviewed & Updated per EMR.   Objective:  BP 127/91 mmHg  Pulse 72  Temp(Src) 98.1 F (36.7 C) (Oral)  Wt 292 lb 4.8 oz (132.586 kg)  LMP 10/07/2015 (Exact Date) Vitals and nursing note reviewed  General: NAD Cardiac: RRR,  Respiratory: CTAB, normal effort Abdomen: obese, soft, nontender, nondistended, Bowel sounds present Skin: warm and dry, no rashes noted Neuro: alert and oriented, no focal deficits   Assessment & Plan:    Abdominal cramping Cramping associated with menstrual cycle. Not impeding with normal function  - likely normal menstrual cramps  -discussed use of Motrin and heating pads for cramps as needed  - will follow up as needed -      Alyssa A. Kennon Rounds MD, MS Family Medicine Resident PGY-2 Pager [phone]

## 2015-11-04 ENCOUNTER — Encounter: Payer: Self-pay | Admitting: Family Medicine

## 2015-11-04 ENCOUNTER — Ambulatory Visit (INDEPENDENT_AMBULATORY_CARE_PROVIDER_SITE_OTHER): Payer: Medicaid Other | Admitting: Family Medicine

## 2015-11-04 VITALS — BP 126/76 | HR 83 | Temp 98.7°F | Ht 63.0 in | Wt 291.5 lb

## 2015-11-04 DIAGNOSIS — J029 Acute pharyngitis, unspecified: Secondary | ICD-10-CM

## 2015-11-04 NOTE — Progress Notes (Signed)
    Subjective   Gloria Proctor is a 20 y.o. female that presents for a same day visit  1. Sore throat: Symptoms started 4 days ago. She has associated coughing. No sneezing, rhinorrhea or post nasal drip. She does report some reflux, however. She is sexually active with one man and does engage in oral sex. She does not use condoms. No history of STIs. She would like testing but not today. She uses Flonase chronically which does not help with symptoms. She smokes 3 black and milds per week to help with feelings of depression and anxiety. She is not ready to quit. No sick contacts. No fevers. She does have intermittent nausea. Overall, symptoms are improving.  ROS Per HPI  Social History  Substance Use Topics  . Smoking status: Current Some Day Smoker    Types: Cigars    Start date: 03/28/2014  . Smokeless tobacco: Current User  . Alcohol Use: No    Allergies  Allergen Reactions  . Caffeine Other (See Comments)    Patient has migraines   . Chocolate Other (See Comments)    Reaction:migraines     Objective   BP 126/76 mmHg  Pulse 83  Temp(Src) 98.7 F (37.1 C) (Oral)  Ht 5\' 3"  (1.6 m)  Wt 291 lb 8 oz (132.224 kg)  BMI 51.65 kg/m2  LMP 10/07/2015 (Exact Date)  General: Well appearing, no distress HEENT:   Head: Normocephalic  Eyes: Pupils equal and reactive to light/accomodation. Extraocular movements intact bilaterally.  Ears: Tympanic membranes normal bilaterally.  Nose/Throat: Nares patent bilaterally. Oropharnx clear and moist. No exudates. No erythema  Neck: No cervical adenopathy bilaterally Respiratory/Chest: Clear to auscultation bilaterally. Unlabored work of breathing. No wheezing or rales.  Assessment and Plan   1. Sore throat Possibly secondary to GERD. Doubt STI. Symptoms improving spontaneously. Can consider trial of PPI but will hold off for now

## 2015-11-04 NOTE — Patient Instructions (Signed)
Thank you for coming to see me today. It was a pleasure. Today we talked about:   Itchy throat: I am unsure of exactly what is causing your symptoms. This could be related to acid reflux. We decided to wait since symptoms are improving. Please let me know if you want to try the acid reducing medication in about 2 weeks.  Please make an appointment to see Dr. Caroleen Hammanumley for follow-up/physical.  If you have any questions or concerns, please do not hesitate to call the office at [phone].  Sincerely,  Jacquelin Hawkingalph Timothy Trudell, MD

## 2015-11-19 ENCOUNTER — Encounter: Payer: Self-pay | Admitting: Family Medicine

## 2015-11-19 ENCOUNTER — Ambulatory Visit (INDEPENDENT_AMBULATORY_CARE_PROVIDER_SITE_OTHER): Payer: Medicaid Other | Admitting: Family Medicine

## 2015-11-19 VITALS — BP 133/57 | HR 90 | Temp 98.2°F | Wt 292.9 lb

## 2015-11-19 DIAGNOSIS — B349 Viral infection, unspecified: Secondary | ICD-10-CM | POA: Diagnosis not present

## 2015-11-19 NOTE — Patient Instructions (Signed)
Thank you for coming to see me today. It was a pleasure. Today we talked about:   Viral illness: this is likely the flu. Please continue to take Tylenol OR Dayquil, but not both. Drink plenty of fluids. If you get worse, or develop trouble breathing, please return for reevaluation  If you have any questions or concerns, please do not hesitate to call the office at [phone].  Sincerely,  Jacquelin Hawkingalph Taelon Bendorf, MD

## 2015-11-19 NOTE — Progress Notes (Signed)
    Subjective   Gloria Proctor is a 20 y.o. female that presents for a same day visit  1. Sore throat: Symptoms started two days ago. She states that she woke up with a sore throat. She felt hot with associated chills, headache, rhinorrhea, sneezing and body aches. She did not get a flu shot. She has sick contacts at work, stating that they had the flu. Symptoms have improved slightly. She has been taking Dayquil and Tylenol which have helped.  ROS Per HPI  Social History  Substance Use Topics  . Smoking status: Current Some Day Smoker    Types: Cigars    Start date: 03/28/2014  . Smokeless tobacco: Current User  . Alcohol Use: No    Allergies  Allergen Reactions  . Caffeine Other (See Comments)    Patient has migraines   . Chocolate Other (See Comments)    Reaction:migraines     Objective   BP 133/57 mmHg  Pulse 90  Temp(Src) 98.2 F (36.8 C) (Oral)  Wt 292 lb 14.4 oz (132.859 kg)  LMP 10/07/2015  General: Well appearing, no distress HEENT:   Head:  Normocephalic  Eyes: Pupils equal and reactive to light/accomodation. Extraocular movements intact bilaterally.  Ears: Tympanic membrane normal on right; cerumen impaction in left  Nose/Throat: Nares patent bilaterally. Oropharnx clear and moist.  Neck: No cervical adenopathy bilaterally Respiratory/Chest: Clear to auscultation bilaterally. Unlabored work of breathing. No wheezing or rales.  Assessment and Plan   1. Viral illness Possible flu. Appears to be improving. Opts for conservative management. Continue Tylenol OR Dayquil. Increase fluid intake. Return precautions discussed.

## 2015-12-18 ENCOUNTER — Emergency Department (HOSPITAL_COMMUNITY)
Admission: EM | Admit: 2015-12-18 | Discharge: 2015-12-18 | Disposition: A | Discharge status: Home / Self Care | Payer: Medicaid Other | Attending: Emergency Medicine | Admitting: Emergency Medicine

## 2015-12-18 ENCOUNTER — Encounter (HOSPITAL_COMMUNITY): Payer: Self-pay | Admitting: *Deleted

## 2015-12-18 DIAGNOSIS — Z8659 Personal history of other mental and behavioral disorders: Secondary | ICD-10-CM | POA: Insufficient documentation

## 2015-12-18 DIAGNOSIS — Z7951 Long term (current) use of inhaled steroids: Secondary | ICD-10-CM | POA: Diagnosis not present

## 2015-12-18 DIAGNOSIS — R21 Rash and other nonspecific skin eruption: Secondary | ICD-10-CM | POA: Diagnosis present

## 2015-12-18 DIAGNOSIS — Z8679 Personal history of other diseases of the circulatory system: Secondary | ICD-10-CM | POA: Diagnosis not present

## 2015-12-18 DIAGNOSIS — F17210 Nicotine dependence, cigarettes, uncomplicated: Secondary | ICD-10-CM | POA: Insufficient documentation

## 2015-12-18 DIAGNOSIS — L299 Pruritus, unspecified: Secondary | ICD-10-CM | POA: Diagnosis not present

## 2015-12-18 MED ORDER — PREDNISONE 20 MG PO TABS
20.0000 mg | ORAL_TABLET | Freq: Once | ORAL | Status: AC
  Administered 2015-12-18: 20 mg via ORAL
  Filled 2015-12-18: qty 1

## 2015-12-18 MED ORDER — HYDROXYZINE HCL 25 MG PO TABS
25.0000 mg | ORAL_TABLET | Freq: Once | ORAL | Status: AC
  Administered 2015-12-18: 25 mg via ORAL
  Filled 2015-12-18: qty 1

## 2015-12-18 MED ORDER — HYDROXYZINE HCL 25 MG PO TABS: 25.0000 mg | ORAL_TABLET | Freq: Three times a day (TID) | ORAL | Status: DC | PRN

## 2015-12-18 NOTE — Discharge Instructions (Signed)
Please call your primary physician first thing Monday morning to schedule a follow up appointment.  Use medication provided as needed for itching. As discussed, be mindful of detergents, soaps, etc.

## 2015-12-18 NOTE — ED Notes (Signed)
Pt reports a one month HX of rash and Itching is now worse.

## 2015-12-18 NOTE — ED Notes (Signed)
Declined W/C at D/C and was escorted to lobby by RN. 

## 2015-12-18 NOTE — ED Provider Notes (Signed)
CSN: [id number]     Arrival date & time 12/18/15  0756 History   First MD Initiated Contact with Patient 12/18/15 [phone]     Chief Complaint  Patient presents with  . Rash     (Consider location/radiation/quality/duration/timing/severity/associated sxs/prior Treatment) Patient is a 20 y.o. female presenting with rash. The history is provided by the patient and medical records. No language interpreter was used.  Rash Associated symptoms: no fever    Gloria Proctor is a 20 y.o. female  with a PMH of anxiety/depression/migraines who presents to the Emergency Department complaining of 1 month history of worsening pruritus. Itching occurs all over her entire body. No medications taken for symptoms. She denies any new foods, medications, soaps, lotions, detergents, or any other known triggers. Denies insect bites or being outside at onset of symptoms. Denies fever, shortness of breath, facial swelling.   Past Medical History  Diagnosis Date  . Migraines   . Anxiety   . Depression    Past Surgical History  Procedure Laterality Date  . Tonsillectomy and adenoidectomy N/A 08/16/2013    Procedure: TONSILLECTOMY AND ADENOIDECTOMY;  Surgeon: Flo Shanks, MD;  Location: [facility];  Service: ENT;  Laterality: N/A;  . Multiple extractions with alveoloplasty N/A 06/04/2015    Procedure: REMOVAL OF WISDOM TEETH;  Surgeon: Ocie Doyne, DDS;  Location: [facility];  Service: Oral Surgery;  Laterality: N/A;   Family History  Problem Relation Age of Onset  . Migraines Mother     Started having migraines during adulthood, after child birth  . Heart attack Paternal Grandfather    Social History  Substance Use Topics  . Smoking status: Current Some Day Smoker    Types: Cigars    Start date: 03/28/2014  . Smokeless tobacco: Current User  . Alcohol Use: No   OB History    No data available     Review of Systems  Constitutional: Negative for fever.  Skin: Positive for rash.      Allergies   Caffeine and Chocolate  Home Medications   Prior to Admission medications   Medication Sig Start Date End Date Taking? Authorizing Provider  fluticasone (FLONASE) 50 MCG/ACT nasal spray Place 1 spray into both nostrils daily. 09/27/15   [place] N Rumley, DO  hydrOXYzine (ATARAX/VISTARIL) 25 MG tablet Take 1 tablet (25 mg total) by mouth every 8 (eight) hours as needed for itching. 12/18/15   Chase Picket Anaija Wissink, PA-C  ibuprofen (ADVIL,MOTRIN) 600 MG tablet Take 1 pill every 3 hours as needed for migraine for up to 3 days 05/27/15   Narda Bonds, MD   BP 130/68 mmHg  Temp(Src) 98.4 F (36.9 C) (Oral)  Resp 16  Ht  (1.6 m)  Wt 122.471 kg  BMI 47.84 kg/m2  SpO2 100%  LMP 10/07/2015 Physical Exam  Constitutional: She is oriented to person, place, and time. She appears well-developed and well-nourished.  Alert, NAD. Itching throughout interview and appears uncomfortable.   HENT:  Head: Normocephalic and atraumatic.  Mouth/Throat: Oropharynx is clear and moist. No oropharyngeal exudate.  Airway patent.  Neck: Normal range of motion. Neck supple.  Cardiovascular: Normal rate, regular rhythm and normal heart sounds.  Exam reveals no gallop and no friction rub.   No murmur heard. Pulmonary/Chest: Effort normal and breath sounds normal. No respiratory distress. She has no wheezes. She has no rales.  Abdominal: Soft. She exhibits no distension. There is no tenderness.  Musculoskeletal: She exhibits no edema.  Lymphadenopathy:  She has no cervical adenopathy.  Neurological: She is alert and oriented to person, place, and time.  Skin: Skin is warm and dry.  Non-erythematous pinpoint and papular rash over upper extremities, chest, back.   Nursing note and vitals reviewed.   ED Course  Procedures (including critical care time) Labs Review Labs Reviewed - No data to display  Imaging Review No results found. I have personally reviewed and evaluated these images and lab results as  part of my medical decision-making.   EKG Interpretation None      MDM   Final diagnoses:  Pruritus   Gloria Proctor presents to ED for 1 month history of worsening pruritus and non-erythematous rash of the upper extremities, chest, abdomen. On exam patient is afebrile and well-appearing other than rash as described in physical exam. Prednisone and hydroxyzine given in the ED. Patient was reevaluated after medications were given, and states noticeable improvement of itching. Patient is followed by family practice and states she can get an appointment quickly. Hydroxyzine given for symptomatic control and patient to call family practice on Monday morning to schedule follow-up appointment for further evaluation and management of a symptoms. Return precautions were given and all questions were answered.   [facility] Surgery CenterJaime Pilcher Baylei Siebels, PA-C 12/18/15 [id number]  Cathren LaineKevin Steinl, MD 12/19/15 [phone]

## 2016-01-18 ENCOUNTER — Ambulatory Visit: Payer: Medicaid Other | Admitting: Family Medicine

## 2016-01-27 ENCOUNTER — Inpatient Hospital Stay
Admission: AD | Admit: 2016-01-27 | Discharge: 2016-01-28 | DRG: 885 | Disposition: A | Discharge status: Home / Self Care | Payer: Medicaid Other | Source: Intra-hospital | Attending: Psychiatry | Admitting: Psychiatry

## 2016-01-27 ENCOUNTER — Emergency Department (HOSPITAL_COMMUNITY)
Admission: EM | Admit: 2016-01-27 | Discharge: 2016-01-27 | Disposition: A | Discharge status: Other Acute Inpatient Hospital | Payer: Medicaid Other | Attending: Emergency Medicine | Admitting: Emergency Medicine

## 2016-01-27 ENCOUNTER — Encounter (HOSPITAL_COMMUNITY): Payer: Self-pay

## 2016-01-27 DIAGNOSIS — Z9889 Other specified postprocedural states: Secondary | ICD-10-CM

## 2016-01-27 DIAGNOSIS — F17210 Nicotine dependence, cigarettes, uncomplicated: Secondary | ICD-10-CM | POA: Insufficient documentation

## 2016-01-27 DIAGNOSIS — F17200 Nicotine dependence, unspecified, uncomplicated: Secondary | ICD-10-CM | POA: Diagnosis present

## 2016-01-27 DIAGNOSIS — E669 Obesity, unspecified: Secondary | ICD-10-CM | POA: Diagnosis present

## 2016-01-27 DIAGNOSIS — R45851 Suicidal ideations: Secondary | ICD-10-CM | POA: Diagnosis present

## 2016-01-27 DIAGNOSIS — F17290 Nicotine dependence, other tobacco product, uncomplicated: Secondary | ICD-10-CM | POA: Diagnosis present

## 2016-01-27 DIAGNOSIS — F332 Major depressive disorder, recurrent severe without psychotic features: Secondary | ICD-10-CM | POA: Diagnosis present

## 2016-01-27 DIAGNOSIS — Z791 Long term (current) use of non-steroidal anti-inflammatories (NSAID): Secondary | ICD-10-CM | POA: Diagnosis not present

## 2016-01-27 DIAGNOSIS — F322 Major depressive disorder, single episode, severe without psychotic features: Secondary | ICD-10-CM | POA: Diagnosis present

## 2016-01-27 DIAGNOSIS — F1220 Cannabis dependence, uncomplicated: Secondary | ICD-10-CM | POA: Diagnosis present

## 2016-01-27 DIAGNOSIS — F329 Major depressive disorder, single episode, unspecified: Secondary | ICD-10-CM | POA: Diagnosis present

## 2016-01-27 DIAGNOSIS — Z818 Family history of other mental and behavioral disorders: Secondary | ICD-10-CM

## 2016-01-27 DIAGNOSIS — Z6841 Body Mass Index (BMI) 40.0 and over, adult: Secondary | ICD-10-CM

## 2016-01-27 DIAGNOSIS — Z8249 Family history of ischemic heart disease and other diseases of the circulatory system: Secondary | ICD-10-CM | POA: Diagnosis not present

## 2016-01-27 LAB — URINALYSIS W MICROSCOPIC (NOT AT ARMC)
Bilirubin Urine: NEGATIVE
Glucose, UA: NEGATIVE mg/dL
Hgb urine dipstick: NEGATIVE
Ketones, ur: NEGATIVE mg/dL
Leukocytes, UA: NEGATIVE
Nitrite: NEGATIVE
Protein, ur: NEGATIVE mg/dL
Specific Gravity, Urine: 1.027 (ref 1.005–1.030)
pH: 5.5 (ref 5.0–8.0)

## 2016-01-27 LAB — CBC
HCT: 40 % (ref 36.0–46.0)
Hemoglobin: 13.4 g/dL (ref 12.0–15.0)
MCH: 28.8 pg (ref 26.0–34.0)
MCHC: 33.5 g/dL (ref 30.0–36.0)
MCV: 85.8 fL (ref 78.0–100.0)
Platelets: 480 10*3/uL — ABNORMAL HIGH (ref 150–400)
RBC: 4.66 MIL/uL (ref 3.87–5.11)
RDW: 14.1 % (ref 11.5–15.5)
WBC: 10.8 10*3/uL — ABNORMAL HIGH (ref 4.0–10.5)

## 2016-01-27 LAB — RAPID URINE DRUG SCREEN, HOSP PERFORMED
Amphetamines: NOT DETECTED
Barbiturates: NOT DETECTED
Benzodiazepines: NOT DETECTED
Cocaine: NOT DETECTED
Opiates: NOT DETECTED
Tetrahydrocannabinol: POSITIVE — AB

## 2016-01-27 LAB — COMPREHENSIVE METABOLIC PANEL WITH GFR
ALT: 33 U/L (ref 14–54)
AST: 29 U/L (ref 15–41)
Albumin: 4.1 g/dL (ref 3.5–5.0)
Alkaline Phosphatase: 67 U/L (ref 38–126)
Anion gap: 7 (ref 5–15)
BUN: 10 mg/dL (ref 6–20)
CO2: 24 mmol/L (ref 22–32)
Calcium: 8.9 mg/dL (ref 8.9–10.3)
Chloride: 106 mmol/L (ref 101–111)
Creatinine, Ser: 0.91 mg/dL (ref 0.44–1.00)
GFR calc Af Amer: >60 mL/min
GFR calc non Af Amer: >60 mL/min
Glucose, Bld: 104 mg/dL — ABNORMAL HIGH (ref 65–99)
Potassium: 3.7 mmol/L (ref 3.5–5.1)
Sodium: 137 mmol/L (ref 135–145)
Total Bilirubin: 0.5 mg/dL (ref 0.3–1.2)
Total Protein: 8 g/dL (ref 6.5–8.1)

## 2016-01-27 LAB — PREGNANCY, URINE: Preg Test, Ur: NEGATIVE

## 2016-01-27 LAB — ETHANOL: Alcohol, Ethyl (B): <5 mg/dL (ref <5)

## 2016-01-27 MED ORDER — TRAZODONE HCL 50 MG PO TABS
50.0000 mg | ORAL_TABLET | Freq: Every evening | ORAL | Status: DC | PRN
  Administered 2016-01-27: 50 mg via ORAL
  Filled 2016-01-27: qty 1

## 2016-01-27 MED ORDER — ACETAMINOPHEN 325 MG PO TABS: 650.0000 mg | ORAL_TABLET | Freq: Four times a day (QID) | ORAL | Status: DC | PRN

## 2016-01-27 MED ORDER — MAGNESIUM HYDROXIDE 400 MG/5ML PO SUSP: 30.0000 mL | Freq: Every day | ORAL | Status: DC | PRN

## 2016-01-27 MED ORDER — NICOTINE 21 MG/24HR TD PT24: 21.0000 mg | MEDICATED_PATCH | Freq: Every day | TRANSDERMAL | Status: DC

## 2016-01-27 MED ORDER — LORAZEPAM 1 MG PO TABS: 1.0000 mg | ORAL_TABLET | Freq: Three times a day (TID) | ORAL | Status: DC | PRN

## 2016-01-27 MED ORDER — IBUPROFEN 200 MG PO TABS: 600.0000 mg | ORAL_TABLET | Freq: Three times a day (TID) | ORAL | Status: DC | PRN

## 2016-01-27 MED ORDER — ALUM & MAG HYDROXIDE-SIMETH 200-200-20 MG/5ML PO SUSP: 30.0000 mL | ORAL | Status: DC | PRN

## 2016-01-27 NOTE — ED Notes (Signed)
Delay in lab draws due to provider wanting to wait.

## 2016-01-27 NOTE — Plan of Care (Signed)
Problem: Coping: Goal: Ability to verbalize feelings will improve Outcome: Progressing Patient states that she is depressed.

## 2016-01-27 NOTE — Consult Note (Signed)
[facility] Consult   Reason for Consult:  Depression with suicide attempt Referring Physician:  EDP Patient Identification: Gloria Proctor MRN:  [id number] Principal Diagnosis: Major depressive disorder, recurrent episode, severe ([place]) Diagnosis:   Patient Active Problem List   Diagnosis Date Noted  . Major depressive disorder, recurrent episode, severe ([place]) [F33.2] 01/27/2016    Priority: High  . MDD (major depressive disorder), single episode, severe ([place]) [F32.2] 01/27/2016  . Abdominal cramping [R10.9] 10/12/2015  . Dry skin [L85.3] 07/19/2015  . Amenorrhea [N91.2] 07/19/2015  . Hidradenitis suppurativa [L73.2] 12/21/2014  . Herpes simplex [B00.9] 04/29/2014  . Axillary abscess [L02.419] 04/21/2014  . Adjustment disorder with mixed anxiety and depressed mood [F43.23] 02/05/2014  . Contraception management [Z30.9] 09/22/2013  . Abnormal involuntary movement [R25.9] 08/15/2013  . Observed seizure-like activity ([place]) [R56.9] 08/15/2013  . Migraine without aura and without status migrainosus, not intractable [G43.009] 08/15/2013  . Movement disorder [G25.9] 07/22/2013  . Obesity [E66.9] 07/02/2008  . RHINITIS, ALLERGIC [J30.9] 10/25/2006    Total Time spent with patient: 45 minutes  Subjective:   Gloria Proctor is a 20 y.o. female patient admitted with suicide attempt.  HPI:  20 yo female who presented to the ED after locking herself in the bathroom with a knife to cut herself in a suicide attempt.  She has been depressed since Monday when her boyfriend broke up with her, crying and isolative since this time.  Minimizes her actions on assessment.  She does report being diagnosed with depression and anxiety two years ago and prescribed Xanax which does not take.  Currently, lives with her mom and works full time.  Denies homicidal ideations and alcohol/drug abuse.  Past Psychiatric History: depression, anxiety  Risk to Self: Suicidal Ideation: No Suicidal  Intent: No Is patient at risk for suicide?: No Suicidal Plan?: No Access to Means: No What has been your use of drugs/alcohol within the last 12 months?: Denies How many times?: 0 Other Self Harm Risks: cutting Triggers for Past Attempts: None known Intentional Self Injurious Behavior: Cutting Comment - Self Injurious Behavior: cut for first time tonight ("I don't want to feel the pain") Risk to Others: Homicidal Ideation: No Thoughts of Harm to Others: No Current Homicidal Intent: No Current Homicidal Plan: No Access to Homicidal Means: No Identified Victim: Denies History of harm to others?: No Assessment of Violence: None Noted Violent Behavior Description: Denies Does patient have access to weapons?: No Criminal Charges Pending?: No Does patient have a court date: No Prior Inpatient Therapy: Prior Inpatient Therapy: No Prior Therapy Dates: N/A Prior Therapy Facilty/Provider(s): N/A Reason for Treatment: N/A Prior Outpatient Therapy: Prior Outpatient Therapy: Yes Prior Therapy Dates: 2015 Prior Therapy Facilty/Provider(s): [facility] Reason for Treatment: Medication Management Does patient have an ACCT team?: No Does patient have Intensive In-House Services?  : No Does patient have [facility] services? : No Does patient have P4CC services?: No  Past Medical History:  Past Medical History  Diagnosis Date  . Migraines   . Anxiety   . Depression     Past Surgical History  Procedure Laterality Date  . Tonsillectomy and adenoidectomy N/A 08/16/2013    Procedure: TONSILLECTOMY AND ADENOIDECTOMY;  Surgeon: Jodi Marble, MD;  Location: [facility];  Service: ENT;  Laterality: N/A;  . Multiple extractions with alveoloplasty N/A 06/04/2015    Procedure: REMOVAL OF WISDOM TEETH;  Surgeon: Diona Browner, DDS;  Location: [facility];  Service: Oral Surgery;  Laterality: N/A;   Family History:  Family  History  Problem Relation Age of Onset  . Migraines Mother     Started having migraines during  adulthood, after child birth  . Heart attack Paternal Grandfather    Family Psychiatric  History: none Social History:  History  Alcohol Use No     History  Drug Use No    Social History   Social History  . Marital Status: Single    Spouse Name: N/A  . Number of Children: N/A  . Years of Education: N/A   Social History Main Topics  . Smoking status: Current Some Day Smoker    Types: Cigars    Start date: 03/28/2014  . Smokeless tobacco: Current User  . Alcohol Use: No  . Drug Use: No  . Sexual Activity: No   Other Topics Concern  . None   Social History Narrative   Additional Social History:    Allergies:   Allergies  Allergen Reactions  . Caffeine Other (See Comments)    Patient has migraines   . Chocolate Other (See Comments)    Reaction:migraines    Labs:  Results for orders placed or performed during the hospital encounter of 01/27/16 (from the past 48 hour(s))  Comprehensive metabolic panel     Status: Abnormal   Collection Time: 01/27/16  4:25 AM  Result Value Ref Range   Sodium 137 135 - 145 mmol/L   Potassium 3.7 3.5 - 5.1 mmol/L   Chloride 106 101 - 111 mmol/L   CO2 24 22 - 32 mmol/L   Glucose, Bld 104 (H) 65 - 99 mg/dL   BUN 10 6 - 20 mg/dL   Creatinine, Ser 0.91 0.44 - 1.00 mg/dL   Calcium 8.9 8.9 - 10.3 mg/dL   Total Protein 8.0 6.5 - 8.1 g/dL   Albumin 4.1 3.5 - 5.0 g/dL   AST 29 15 - 41 U/L   ALT 33 14 - 54 U/L   Alkaline Phosphatase 67 38 - 126 U/L   Total Bilirubin 0.5 0.3 - 1.2 mg/dL   GFR calc non Af Amer >60 >60 mL/min   GFR calc Af Amer >60 >60 mL/min    Comment: (NOTE) The eGFR has been calculated using the CKD EPI equation. This calculation has not been validated in all clinical situations. eGFR's persistently <60 mL/min signify possible Chronic Kidney Disease.    Anion gap 7 5 - 15  cbc     Status: Abnormal   Collection Time: 01/27/16  4:25 AM  Result Value Ref Range   WBC 10.8 (H) 4.0 - 10.5 K/uL   RBC 4.66 3.87 -  5.11 MIL/uL   Hemoglobin 13.4 12.0 - 15.0 g/dL   HCT 40.0 36.0 - 46.0 %   MCV 85.8 78.0 - 100.0 fL   MCH 28.8 26.0 - 34.0 pg   MCHC 33.5 30.0 - 36.0 g/dL   RDW 14.1 11.5 - 15.5 %   Platelets 480 (H) 150 - 400 K/uL  Ethanol     Status: None   Collection Time: 01/27/16  4:26 AM  Result Value Ref Range   Alcohol, Ethyl (B) <5 <5 mg/dL    Comment:        LOWEST DETECTABLE LIMIT FOR SERUM ALCOHOL IS 5 mg/dL FOR MEDICAL PURPOSES ONLY     Current Facility-Administered Medications  Medication Dose Route Frequency Provider Last Rate Last Dose  . ibuprofen (ADVIL,MOTRIN) tablet 600 mg  600 mg Oral Q8H PRN Antonietta Breach, PA-C      . nicotine (NICODERM CQ -  dosed in mg/24 hours) patch 21 mg  21 mg Transdermal Daily Antonietta Breach, PA-C   21 mg at 01/27/16 1021   Current Outpatient Prescriptions  Medication Sig Dispense Refill  . fluticasone (FLONASE) 50 MCG/ACT nasal spray Place 1 spray into both nostrils daily. (Patient not taking: Reported on 01/27/2016) 16 g 3  . hydrOXYzine (ATARAX/VISTARIL) 25 MG tablet Take 1 tablet (25 mg total) by mouth every 8 (eight) hours as needed for itching. (Patient not taking: Reported on 01/27/2016) 15 tablet 0  . ibuprofen (ADVIL,MOTRIN) 600 MG tablet Take 1 pill every 3 hours as needed for migraine for up to 3 days (Patient not taking: Reported on 01/27/2016) 30 tablet 0    Musculoskeletal: Strength & Muscle Tone: within normal limits Gait & Station: normal Patient leans: N/A  Psychiatric Specialty Exam: Physical Exam  Constitutional: She is oriented to person, place, and time. She appears well-developed and well-nourished.  HENT:  Head: Normocephalic.  Neck: Normal range of motion.  Respiratory: Effort normal.  Musculoskeletal: Normal range of motion.  Neurological: She is alert and oriented to person, place, and time.  Skin: Skin is warm and dry.  Psychiatric: Her speech is normal. She is withdrawn. Cognition and memory are normal. She expresses  impulsivity. She exhibits a depressed mood. She expresses suicidal ideation. She expresses suicidal plans.    Review of Systems  Constitutional: Negative.   HENT: Negative.   Eyes: Negative.   Respiratory: Negative.   Cardiovascular: Negative.   Gastrointestinal: Negative.   Genitourinary: Negative.   Musculoskeletal: Negative.   Skin: Negative.   Neurological: Negative.   Endo/Heme/Allergies: Negative.   Psychiatric/Behavioral: Positive for depression and suicidal ideas.    Blood pressure 121/62, pulse 64, temperature 98.1 F (36.7 C), temperature source Oral, resp. rate 18, height 5' 4"  (1.626 m), weight 140.161 kg (309 lb), last menstrual period 12/27/2015, SpO2 100 %.Body mass index is 53.01 kg/(m^2).  General Appearance: Disheveled  Eye Contact:  Fair  Speech:  Normal Rate  Volume:  Normal  Mood:  Depressed  Affect:  Congruent  Thought Process:  Coherent  Orientation:  Full (Time, Place, and Person)  Thought Content:  Rumination  Suicidal Thoughts:  Yes.  with intent/plan  Homicidal Thoughts:  No  Memory:  Immediate;   Fair Recent;   Fair Remote;   Fair  Judgement:  Poor  Insight:  Lacking  Psychomotor Activity:  Decreased  Concentration:  Concentration: Fair and Attention Span: Fair  Recall:  [employer] of Knowledge:  Good  Language:  Good  Akathisia:  No  Handed:  Right  AIMS (if indicated):     Assets:  Housing Leisure Time Physical Health Resilience Social Support Vocational/Educational  ADL's:  Intact  Cognition:  WNL  Sleep:        Treatment Plan Summary: Daily contact with patient to assess and evaluate symptoms and progress in treatment, Medication management and Plan major depressive disorder, recurrent, severe without psychotic features: -Crisis stabilization -Medication management:  No medications started until pregnancy test completed  -Individual counseling  Disposition: Transfer to [facility] for admission  Waylan Boga,  NP 01/27/2016 10:34 AM Patient seen face-to-face for psychiatric evaluation, chart reviewed and case discussed with the physician extender and developed treatment plan. Reviewed the information documented and agree with the treatment plan. Corena Pilgrim, MD

## 2016-01-27 NOTE — ED Notes (Signed)
Patients father was visiting and patient became very upset about having to be admitted somewhere.  She is very sad and tearful.  She did get up and provide us with a urine specimen, which has been sent to the lab.

## 2016-01-27 NOTE — Tx Team (Signed)
Initial Interdisciplinary Treatment Plan   PATIENT STRESSORS: Loss of boyfriend Traumatic event   PATIENT STRENGTHS: Ability for insight Supportive family/friends   PROBLEM LIST: Problem List/Patient Goals Date to be addressed Date deferred Reason deferred Estimated date of resolution  Depression      Anxiety                                                 DISCHARGE CRITERIA:  Improved stabilization in mood, thinking, and/or behavior Motivation to continue treatment in a less acute level of care Verbal commitment to aftercare and medication compliance  PRELIMINARY DISCHARGE PLAN: Outpatient therapy Return to previous living arrangement  PATIENT/FAMIILY INVOLVEMENT: This treatment plan has been presented to and reviewed with the patient, Gloria Proctor.  The patient and family have been given the opportunity to ask questions and make suggestions.  Hadiya Spoerl W Martel Galvan 01/27/2016, 6:01 PM

## 2016-01-27 NOTE — ED Provider Notes (Signed)
CSN: [id number]     Arrival date & time 01/27/16  0209 History   First MD Initiated Contact with Patient 01/27/16 0214     Chief Complaint  Patient presents with  . Depression     (Consider location/radiation/quality/duration/timing/severity/associated sxs/prior Treatment) HPI Comments: 20 year old female with a history of anxiety and depression presents to the emergency department for worsening depression and anxiety. She states that she became very depressed this evening secondary to her boyfriend breaking up with her. She states that she feels "heartbroken". She is very tearful. She reports being in the bathroom this evening with a knife. She made 1 superficial cut to her volar left wrist because she "didn't want to feel any pain". She denies intent to harm herself or any suicidal ideations. No HI, etoh use, or illicit drug use.  Patient is a 20 y.o. female presenting with depression. The history is provided by the patient. No language interpreter was used.  Depression    Past Medical History  Diagnosis Date  . Migraines   . Anxiety   . Depression    Past Surgical History  Procedure Laterality Date  . Tonsillectomy and adenoidectomy N/A 08/16/2013    Procedure: TONSILLECTOMY AND ADENOIDECTOMY;  Surgeon: Flo ShanksKarol Wolicki, MD;  Location: [facility];  Service: ENT;  Laterality: N/A;  . Multiple extractions with alveoloplasty N/A 06/04/2015    Procedure: REMOVAL OF WISDOM TEETH;  Surgeon: Ocie DoyneScott Jensen, DDS;  Location: [facility];  Service: Oral Surgery;  Laterality: N/A;   Family History  Problem Relation Age of Onset  . Migraines Mother     Started having migraines during adulthood, after child birth  . Heart attack Paternal Grandfather    Social History  Substance Use Topics  . Smoking status: Current Some Day Smoker    Types: Cigars    Start date: 03/28/2014  . Smokeless tobacco: Current User  . Alcohol Use: No   OB History    No data available      Review of Systems   Psychiatric/Behavioral: Positive for depression, behavioral problems and self-injury. Negative for suicidal ideas.  All other systems reviewed and are negative.   Allergies  Caffeine and Chocolate  Home Medications   Prior to Admission medications   Medication Sig Start Date End Date Taking? Authorizing Provider  fluticasone (FLONASE) 50 MCG/ACT nasal spray Place 1 spray into both nostrils daily. Patient not taking: Reported on 01/27/2016 09/27/15   Lora Havensaleigh N Rumley, DO  hydrOXYzine (ATARAX/VISTARIL) 25 MG tablet Take 1 tablet (25 mg total) by mouth every 8 (eight) hours as needed for itching. Patient not taking: Reported on 01/27/2016 12/18/15   [facility] PavilionJaime Pilcher Ward, PA-C  ibuprofen (ADVIL,MOTRIN) 600 MG tablet Take 1 pill every 3 hours as needed for migraine for up to 3 days Patient not taking: Reported on 01/27/2016 05/27/15   Narda Bondsalph A Nettey, MD   BP 121/62 mmHg  Pulse 64  Temp(Src) 98.1 F (36.7 C) (Oral)  Resp 18  Ht 5\' 4"  (1.626 m)  Wt 140.161 kg  BMI 53.01 kg/m2  SpO2 100%  LMP 12/27/2015   Physical Exam  Constitutional: She is oriented to person, place, and time. She appears well-developed and well-nourished. No distress.  HENT:  Head: Normocephalic and atraumatic.  Eyes: Conjunctivae and EOM are normal. No scleral icterus.  Neck: Normal range of motion.  Pulmonary/Chest: Effort normal. No respiratory distress.  Musculoskeletal: Normal range of motion.  Neurological: She is alert and oriented to person, place, and time. She exhibits normal muscle  tone. Coordination normal.  Skin: Skin is warm and dry. No rash noted. She is not diaphoretic. No erythema. No pallor.  Psychiatric: Her speech is normal and behavior is normal. Her mood appears anxious. She exhibits a depressed mood. She expresses no homicidal and no suicidal ideation.  Patient tearful  Nursing note and vitals reviewed.   ED Course  Procedures (including critical care time) Labs Review Labs Reviewed   COMPREHENSIVE METABOLIC PANEL - Abnormal; Notable for the following:    Glucose, Bld 104 (*)    All other components within normal limits  CBC - Abnormal; Notable for the following:    WBC 10.8 (*)    Platelets 480 (*)    All other components within normal limits  ETHANOL  URINE RAPID DRUG SCREEN, HOSP PERFORMED  POC URINE PREG, ED    Imaging Review No results found.   I have personally reviewed and evaluated these images and lab results as part of my medical decision-making.   EKG Interpretation None      MDM   Final diagnoses:  Severe single current episode of major depressive disorder, without psychotic features (H[facility])    20 year old female presents to the emergency department for worsening depression. She does have an instance this evening where she cut her wrist with a knife. There is little to no evidence of any self-inflicted injury. Patient reports that this was not in an attempt to kill herself. She denies suicidal ideations during my encounter with her. The patient has been evaluated by TTS who recommended inpatient management. If urine pregnancy is negative, patient is appropriately medically cleared. She is currently awaiting a bed at [facility]. Disposition for transfer to be set by oncoming ED provider.   Filed Vitals:   01/27/16 0217 01/27/16 0607  BP: 127/80 121/62  Pulse: 88 64  Temp: 98.1 F (36.7 C) 98.1 F (36.7 C)  TempSrc: Oral Oral  Resp: 18 18  Height:  (1.626 m)   Weight: 140.161 kg   SpO2: 99% 100%     Antony Madura, PA-C 01/27/16 0612  Cy Blamer, MD 01/27/16 [phone]

## 2016-01-27 NOTE — BH Assessment (Signed)
[facility] Assessment Progress Note  Per Thedore MinsMojeed Akintayo, MD, this pt requires psychiatric hospitalization at this time.  Robinette HainesCalvin Manning calls from [facility] to report that pt has been accepted to their facility by Dr Ardyth HarpsHernandez to Rm 303.  Nanine MeansJamison Lord, NP, concurs with this decision.  Pt has signed Voluntary Admission and Consent for Treatment, as well as Consent to Release Information to her mother, and signed forms have been faxed to [phone].  Pt's nurse, Rudean HittDawnaly, has been notified, and agrees to send original paperwork along with pt via [facility], and to call report to [phone].  Doylene Canninghomas Asha Grumbine, MA Triage Specialist [phone]
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
## 2016-01-27 NOTE — ED Notes (Signed)
Patient ambulatory, [facility] voluntarily.  Signed out, belongings given to [employer] driver.

## 2016-01-27 NOTE — BH Assessment (Signed)
Patient has been accepted to [facility].  Accepting physician is Dr. Ardyth HarpsHernandez.  Attending Physician will be Dr. Jennet MaduroPucilowska.  Patient has been assigned to room 303, by [facility] Charge Nurse Gigi.  Call report to [phone].  Representative/Transfer Coordinator is Astou Lada.  [facility] ER Staff Maisie Fus([place], TTS) made aware of acceptance.

## 2016-01-27 NOTE — Progress Notes (Signed)
D: Pt denies SI/HI/AVH. Pt is pleasant and cooperative. Pt very depressed, pt stated she was overwhelmed due to the situations happening to her.   A: Pt was offered support and encouragement. Pt was given scheduled medications. Pt was encourage to attend groups. Q 15 minute checks were done for safety.   R:Pt attends groups and interacts well with peers and staff. Pt is taking medication. Pt has no complaints at this time .Pt receptive to treatment and safety maintained on unit.

## 2016-01-27 NOTE — Plan of Care (Signed)
Problem: Coping: Goal: Ability to cope will improve Outcome: Progressing Pt stated she was starting to feel a little better, pt stated she felt overwhelmed before  coming in [facility]
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
## 2016-01-27 NOTE — ED Notes (Signed)
Provider at bedside

## 2016-01-27 NOTE — Progress Notes (Signed)
Patient states that she is very depressed. She states that she has been depressed for a long time. She says that it got worse when she found out that her boyfriend was cheating on her. She states that she could not cope with the pain, therefore she tried to cut herself so that she could not feel the pain any more. She currently denies SI and contracts for safety. She denies AH/VH. Patient encouraged to talk to nursing staff when she starts to feel like harming self. Will continue to condole and offer reassurance.

## 2016-01-27 NOTE — BH Assessment (Addendum)
Assessment completed. Consulted with Donell SievertSpencer Simon, PA-C who recommends inpatient treatment once patient is medically cleared. He also recommended that patient be placed under IVC if she is not willing to sign in.  Davina PokeJoVea Kaliana Albino, LCSW Therapeutic Triage Specialist [facility] 01/27/2016 3:53 AM

## 2016-01-27 NOTE — ED Notes (Signed)
Patient denies SI, Hi and AVH at this time. Patient does admit to attention seeking behavior due to break up with boyfriend. Plan of care discussed with patient. Patient voices no complaints or concerns at this time. Encouragement and support provided and safety maintain. Q 15 min safety checks in place.

## 2016-01-28 DIAGNOSIS — F332 Major depressive disorder, recurrent severe without psychotic features: Principal | ICD-10-CM
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
## 2016-01-28 NOTE — Progress Notes (Signed)
  [facility] Adult Case Management Discharge Plan :  Will you be returning to the same living situation after discharge:  Yes,  pt [place] At discharge, do you have transportation home?: Yes,  pt will be picked up by her sister Do you have the ability to pay for your medications: Yes,  pt will be provided with prescriptions at discharge  Release of information consent forms completed and in the chart;  Patient's signature needed at discharge.  Patient to Follow up at: Follow-up Information    [facility] [place]   Why:  Please arrive to the walk-in clinic Monday through Friday from 9-4pm for your assessment for your hospital follow up for substance abuse counseling, medication management and therapy.   Contact information:   [address] [place], [place] [address] Phone: [phone] Fax: [phone]           Next level of care provider has access to [facility] Link:no  Safety Planning and Suicide Prevention discussed: Yes,  completed with pt  Have you used any form of tobacco in the last 30 days? (Cigarettes, Smokeless Tobacco, Cigars, and/or Pipes): Yes  Has patient been referred to the Quitline?: Patient refused referral  Patient has been referred for addiction treatment: Yes  Gloria PeaJonathan F [place] Proctor 01/28/2016, 1:02 PM

## 2016-01-28 NOTE — H&P (Signed)
Psychiatric Admission Assessment Adult  Patient Identification: Gloria Proctor MRN:  [id number] Date of Evaluation:  01/28/2016 Chief Complaint:  Depression Principal Diagnosis: Major depressive disorder, recurrent severe without psychotic features ([place]) Diagnosis:   Patient Active Problem List   Diagnosis Date Noted  . Major depressive disorder, recurrent severe without psychotic features ([place]) [F33.2] 01/27/2016  . Cannabis use disorder, moderate, dependence ([place]) [F12.20] 01/27/2016  . Tobacco use disorder [F17.200] 01/27/2016  . Amenorrhea [N91.2] 07/19/2015  . Hidradenitis suppurativa [L73.2] 12/21/2014  . Migraine without aura and without status migrainosus, not intractable [G43.009] 08/15/2013  . Obesity [E66.9] 07/02/2008  . RHINITIS, ALLERGIC [J30.9] 10/25/2006   History of Present Illness:  Identifying data. Gloria Proctor is a 20 year old female with a history of mild depression and anxiety.  Chief complaint. "I am fine now I want to go home and sign up for school."  History of present illness. Information was obtained from the patient and the chart. The patient has a history of mild depression and anxiety with infrequent panic attacks. This was addressed with the Xanax that the patient does not want to take. She has been in the relationship for the past 9 months but over the past month the boyfriend became unfaithful and eventually broke up with her. She wanted to cut her wrists and locked herself in a bathroom with a knife. She decided to call her sister and the family brought the bathroom door. She was brought to [facility] Emergency room and transferred to [facility] for further treatment. The patient adamantly denies any symptoms of depression but has been stressed out lately and would welcome counselling. She has infrequent panic attacks "once in a blue moon" and has not been taking prescribed Xanax. He denies psychotic symptoms or symptoms suggestive of  bipolar mania. She denies alcohol or illicit substance use.  Past psychiatric history. She did see a psychiatrist once and was prescribed Xanax that she does not like taking. She has never been hospitalized. There were no suicide attempts.  Family psychiatric history. Her mother and sister suffer depression and anxiety. Her sister is taking Zoloft.  Social history. She graduated from high school with honors. 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 She lives with her mother. She has Medicaid.  Total Time spent with patient: 1 hour  Past Psychiatric History: Depression, anxiety, suicidal ideation.  Is the patient at risk to self? No.  Has the patient been a risk to self in the past 6 months? No.  Has the patient been a risk to self within the distant past? No.  Is the patient a risk to others? No.  Has the patient been a risk to others in the past 6 months? No.  Has the patient been a risk to others within the distant past? No.   Prior Inpatient Therapy:   Prior Outpatient Therapy:    Alcohol Screening: 1. How often do you have a drink containing alcohol?: Never 2. How many drinks containing alcohol do you have on a typical day when you are drinking?: 1 or 2 3. How often do you have six or more drinks on one occasion?: Never Preliminary Score: 0 9. Have you or someone else been injured as a result of your drinking?: No 10. Has a relative or friend or a doctor or another health worker been concerned about your drinking or suggested you cut down?: No Alcohol Use Disorder Identification Test Final Score (  AUDIT): 0 Brief Intervention: AUDIT score less than 7 or less-screening does not suggest unhealthy drinking-brief intervention not indicated Substance Abuse History in the last 12 months:  No. Consequences of Substance Abuse: NA Previous Psychotropic Medications: Yes  Psychological Evaluations: No  Past Medical History:  Past  Medical History  Diagnosis Date  . Migraines   . Anxiety   . Depression     Past Surgical History  Procedure Laterality Date  . Tonsillectomy and adenoidectomy N/A 08/16/2013    Procedure: TONSILLECTOMY AND ADENOIDECTOMY;  Surgeon: Jodi Marble, MD;  Location: [facility];  Service: ENT;  Laterality: N/A;  . Multiple extractions with alveoloplasty N/A 06/04/2015    Procedure: REMOVAL OF WISDOM TEETH;  Surgeon: Diona Browner, DDS;  Location: [facility];  Service: Oral Surgery;  Laterality: N/A;   Family History:  Family History  Problem Relation Age of Onset  . Migraines Mother     Started having migraines during adulthood, after child birth  . Heart attack Paternal Grandfather    Family Psychiatric  History: Depression and anxiety. Tobacco Screening: _0 ([phone])::1)@ Social History:  History  Alcohol Use No     History  Drug Use No    Additional Social History:                           Allergies:   Allergies  Allergen Reactions  . Caffeine Other (See Comments)    Patient has migraines   . Chocolate Other (See Comments)    Reaction:migraines   Lab Results:  Results for orders placed or performed during the hospital encounter of 01/27/16 (from the past 48 hour(s))  Comprehensive metabolic panel     Status: Abnormal   Collection Time: 01/27/16  4:25 AM  Result Value Ref Range   Sodium 137 135 - 145 mmol/L   Potassium 3.7 3.5 - 5.1 mmol/L   Chloride 106 101 - 111 mmol/L   CO2 24 22 - 32 mmol/L   Glucose, Bld 104 (H) 65 - 99 mg/dL   BUN 10 6 - 20 mg/dL   Creatinine, Ser 0.91 0.44 - 1.00 mg/dL   Calcium 8.9 8.9 - 10.3 mg/dL   Total Protein 8.0 6.5 - 8.1 g/dL   Albumin 4.1 3.5 - 5.0 g/dL   AST 29 15 - 41 U/L   ALT 33 14 - 54 U/L   Alkaline Phosphatase 67 38 - 126 U/L   Total Bilirubin 0.5 0.3 - 1.2 mg/dL   GFR calc non Af Amer >60 >60 mL/min   GFR calc Af Amer >60 >60 mL/min    Comment: (NOTE) The eGFR has been calculated using the CKD EPI equation. This  calculation has not been validated in all clinical situations. eGFR's persistently <60 mL/min signify possible Chronic Kidney Disease.    Anion gap 7 5 - 15  cbc     Status: Abnormal   Collection Time: 01/27/16  4:25 AM  Result Value Ref Range   WBC 10.8 (H) 4.0 - 10.5 K/uL   RBC 4.66 3.87 - 5.11 MIL/uL   Hemoglobin 13.4 12.0 - 15.0 g/dL   HCT 40.0 36.0 - 46.0 %   MCV 85.8 78.0 - 100.0 fL   MCH 28.8 26.0 - 34.0 pg   MCHC 33.5 30.0 - 36.0 g/dL   RDW 14.1 11.5 - 15.5 %   Platelets 480 (H) 150 - 400 K/uL  Ethanol     Status: None   Collection Time: 01/27/16  4:26  AM  Result Value Ref Range   Alcohol, Ethyl (B) <5 <5 mg/dL    Comment:        LOWEST DETECTABLE LIMIT FOR SERUM ALCOHOL IS 5 mg/dL FOR MEDICAL PURPOSES ONLY   Rapid urine drug screen (hospital performed)     Status: Abnormal   Collection Time: 01/27/16 10:47 AM  Result Value Ref Range   Opiates NONE DETECTED NONE DETECTED   Cocaine NONE DETECTED NONE DETECTED   Benzodiazepines NONE DETECTED NONE DETECTED   Amphetamines NONE DETECTED NONE DETECTED   Tetrahydrocannabinol POSITIVE (A) NONE DETECTED   Barbiturates NONE DETECTED NONE DETECTED    Comment:        DRUG SCREEN FOR MEDICAL PURPOSES ONLY.  IF CONFIRMATION IS NEEDED FOR ANY PURPOSE, NOTIFY LAB WITHIN 5 DAYS.        LOWEST DETECTABLE LIMITS FOR URINE DRUG SCREEN Drug Class       Cutoff (ng/mL) Amphetamine      1000 Barbiturate      200 Benzodiazepine   672 Tricyclics       094 Opiates          300 Cocaine          300 THC              50   Pregnancy, urine     Status: None   Collection Time: 01/27/16 10:47 AM  Result Value Ref Range   Preg Test, Ur NEGATIVE NEGATIVE    Comment:        THE SENSITIVITY OF THIS METHODOLOGY IS >20 mIU/mL.   Urinalysis with microscopic (not at [facility])     Status: Abnormal   Collection Time: 01/27/16 10:47 AM  Result Value Ref Range   Color, Urine YELLOW YELLOW   APPearance CLOUDY (A) CLEAR   Specific Gravity, Urine  1.027 1.005 - 1.030   pH 5.5 5.0 - 8.0   Glucose, UA NEGATIVE NEGATIVE mg/dL   Hgb urine dipstick NEGATIVE NEGATIVE   Bilirubin Urine NEGATIVE NEGATIVE   Ketones, ur NEGATIVE NEGATIVE mg/dL   Protein, ur NEGATIVE NEGATIVE mg/dL   Nitrite NEGATIVE NEGATIVE   Leukocytes, UA NEGATIVE NEGATIVE   WBC, UA 0-5 0 - 5 WBC/hpf   RBC / HPF 0-5 0 - 5 RBC/hpf   Bacteria, UA FEW (A) NONE SEEN   Squamous Epithelial / LPF 0-5 (A) NONE SEEN   Urine-Other MUCOUS PRESENT     Blood Alcohol level:  Lab Results  Component Value Date   ETH <5 70/96/2836    Metabolic Disorder Labs:  No results found for: HGBA1C, MPG No results found for: PROLACTIN No results found for: CHOL, TRIG, HDL, CHOLHDL, VLDL, LDLCALC  Current Medications: Current Facility-Administered Medications  Medication Dose Route Frequency Provider Last Rate Last Dose  . acetaminophen (TYLENOL) tablet 650 mg  650 mg Oral Q6H PRN Hildred Priest, MD      . alum & mag hydroxide-simeth (MAALOX/MYLANTA) 200-200-20 MG/5ML suspension 30 mL  30 mL Oral Q4H PRN Hildred Priest, MD      . magnesium hydroxide (MILK OF MAGNESIA) suspension 30 mL  30 mL Oral Daily PRN Hildred Priest, MD      . traZODone (DESYREL) tablet 50 mg  50 mg Oral QHS PRN Hildred Priest, MD   50 mg at 01/27/16 2223   PTA Medications: Prescriptions prior to admission  Medication Sig Dispense Refill Last Dose  . fluticasone (FLONASE) 50 MCG/ACT nasal spray Place 1 spray into both nostrils daily. (Patient not taking:  Reported on 01/27/2016) 16 g 3 Taking  . hydrOXYzine (ATARAX/VISTARIL) 25 MG tablet Take 1 tablet (25 mg total) by mouth every 8 (eight) hours as needed for itching. (Patient not taking: Reported on 01/27/2016) 15 tablet 0   . ibuprofen (ADVIL,MOTRIN) 600 MG tablet Take 1 pill every 3 hours as needed for migraine for up to 3 days (Patient not taking: Reported on 01/27/2016) 30 tablet 0 Taking    Musculoskeletal: Strength &  Muscle Tone: within normal limits Gait & Station: normal Patient leans: N/A  Psychiatric Specialty Exam: I reviewed physical exam performed in the emergency room and agree with the findings. Physical Exam  Nursing note and vitals reviewed.   Review of Systems  Psychiatric/Behavioral: Positive for depression.  All other systems reviewed and are negative.   Blood pressure 119/64, pulse 71, temperature 98.3 F (36.8 C), temperature source Oral, resp. rate 18, height _0  (1.626 m), weight 126.1 kg (278 lb), last menstrual period 12/27/2015, SpO2 100 %.Body mass index is 47.7 kg/(m^2).  See SRA.                                                  Sleep:  Number of Hours: 6.3       Treatment Plan Summary: Daily contact with patient to assess and evaluate symptoms and progress in treatment and Medication management   Ms. Berrones is a 20 year old female with a history of mild depression and anxiety admitted for suicidal thinking in the context of relationship problems.  1. Suicidal ideation. The patient adamantly denies any thoughts, intentions, or plans to hurt herself or others. She is able to contract for safety. She is forward thinking and optimistic about the future. She is a loving daughter and a sister.  2. Mood and anxiety. The patient does not want to be on medications but is ready to start psychotherapy.  3. Disposition. She will be discharged home with her mother. She will follow up with [facility].   Observation Level/Precautions:  15 minute checks  Laboratory:  CBC Chemistry Profile UDS UA  Psychotherapy:    Medications:    Consultations:    Discharge Concerns:    Estimated LOS:  Other:     I certify that inpatient services furnished can reasonably be expected to improve the patient's condition.    Orson Slick, MD 6/2/201710:44 AM

## 2016-01-28 NOTE — Progress Notes (Signed)
Recreation Therapy Notes  Date: 06.02.17 Time: 1:00 pm Location: Craft Room  Group Topic: Communication, Problem Solving, Teamwork  Goal Area(s) Addresses:  Patient will effectively work with peer towards shared goal. Patient will identify skills used to make activity successful. Patient will identify benefit of using group skills effectively post d/c.  Behavioral Response: Attentive, Interactive  Intervention: [employer] Tower  Activity: Patients were instructed to build a free standing tower out of 15 pipe cleaners. After about 3 minutes of building, patients were told to put their dominant hand behind their back. After about 3 more minutes, patients were told to stop talking to each other.  Education: LRT educated patients on healthy support systems.  Education Outcome: Acknowledges education/In group clarification offered  Clinical Observations/Feedback: Patient completed activity by working with peers to build tower. Patient used effective communication, problem solving, and teamwork skills. Patient contributed to group discussion by stating what skills her team used, how she effectively used the skills, how these skills are related to building a healthy support system, how she felt after using the skills in group, what prevents her from using these skills, and what would change for her if she started using these skills.  Jacquelynn CreeGreene,Guerino Caporale M, LRT/CTRS 01/28/2016 2:56 PM

## 2016-01-28 NOTE — Progress Notes (Signed)
Patient's discharge teaching and instructions discussed with patient. She verbalized understanding. Her belongings were given to her and she did not voice any concerns. Patient was walked to the visitors entrance where family member picked her up.

## 2016-01-28 NOTE — Tx Team (Addendum)
Pt was admitted after the previous tx team mtg that the CSW was present for (on 01/27/16) and before the next scheduled tx meeting (on 02/01/16) and as such, a tx team note was not needed.  Pt also was admitted and discharged within a 24 hour period and as such, a PSA is not needed.  Tedrick [place] Martice Doty, LCSWA, LCAS  01/28/16 

## 2016-01-28 NOTE — [facility] Suicide Risk Assessment (Signed)
[facility] INPATIENT:  Family/Significant Other Suicide Prevention Education  Suicide Prevention Education:  Education Completed; **pt's mother Jonni SangerMeg Hawkins at ph: [phone],  (name of family member/significant other) has been identified by the patient as the family member/significant other with whom the patient will be residing, and identified as the person(s) who will aid the patient in the event of a mental health crisis (suicidal ideations/suicide attempt).  With written consent from the patient, the family member/significant other has been provided the following suicide prevention education, prior to the and/or following the discharge of the patient.  The suicide prevention education provided includes the following:  Suicide risk factors  Suicide prevention and interventions  National Suicide Hotline telephone number  [facility] assessment telephone number  [facility] Emergency Assistance 911  [facility] and/or Residential Mobile Crisis Unit telephone number  Request made of family/significant other to:  Remove weapons (e.g., guns, rifles, knives), all items previously/currently identified as safety concern.    Remove drugs/medications (over-the-counter, prescriptions, illicit drugs), all items previously/currently identified as a safety concern.  The family member/significant other verbalizes understanding of the suicide prevention education information provided.  The family member/significant other agrees to remove the items of safety concern listed above.  Dorothe PeaJonathan F Irini Leet 01/28/2016, 2:17 PM

## 2016-01-28 NOTE — Plan of Care (Signed)
Problem: Education: Goal: Mental status will improve Outcome: Progressing Patient states that she feels much better and clearer in thought. Denies SI and contracts for safety.

## 2016-01-28 NOTE — [facility] Suicide Risk Assessment (Signed)
[facility]   Principal Problem: Major depressive disorder, recurrent severe without psychotic features [facility](HCC) Discharge Diagnoses:  Patient Active Problem List   Diagnosis Date Noted  . Major depressive disorder, recurrent severe without psychotic features ([facility]CC) [F33.2] 01/27/2016  . Cannabis use disorder, moderate, dependence ([facility]) [F12.20] 01/27/2016  . Tobacco use disorder [F17.200] 01/27/2016  . Amenorrhea [N91.2] 07/19/2015  . Hidradenitis suppurativa [L73.2] 12/21/2014  . Migraine without aura and without status migrainosus, not intractable [G43.009] 08/15/2013  . Obesity [E66.9] 07/02/2008  . RHINITIS, ALLERGIC [J30.9] 10/25/2006    Total Time spent with patient: 1 hour  Musculoskeletal: Strength & Muscle Tone: within normal limits Gait & Station: normal Patient leans: N/A  Psychiatric Specialty Exam: Review of Systems  Psychiatric/Behavioral: Positive for depression.  All other systems reviewed and are negative.   Blood pressure 119/64, pulse 71, temperature 98.3 F (36.8 C), temperature source Oral, resp. rate 18, height 5\' 4"  (1.626 m), weight 126.1 kg (278 lb), last menstrual period 12/27/2015, SpO2 100 %.Body mass index is 47.7 kg/(m^2).  See SRA.                                                     Mental Status Per Nursing Assessment::   On Admission:     Demographic Factors:  Adolescent or young adult  Loss Factors: Loss of significant relationship  Historical Factors: Family history of mental illness or substance abuse  Risk Reduction Factors:   Sense of responsibility to family, Employed, Living with another person, especially a relative, Positive social support and Positive coping skills or problem solving skills  Continued Clinical Symptoms:  Depression:   Impulsivity  Cognitive Features That Contribute To Risk:  None    Suicide Risk:  Minimal: No identifiable suicidal ideation.  Patients  presenting with no risk factors but with morbid ruminations; may be classified as minimal risk based on the severity of the depressive symptoms    Plan Of Care/Follow-up recommendations:  Activity:  As tolerated. Diet:  Regular. Other:  Keep follow-up appointments.  Kristine LineaJolanta Uchenna Seufert, MD 01/28/2016, 10:53 AM

## 2016-01-28 NOTE — Progress Notes (Signed)
Patient states that she less depressed and that after the quiet time she had last evening/night, she felt clearer in thought and was beginning to see things in another whole new perspective. She states that she wants to go to school after discharge. Brighter affect, smiling and pleasant. Denies SI and contracts for safety. Encouraged to verbalize feelings to nursing staff. She verbalized understanding.

## 2016-01-28 NOTE — [facility] Suicide Risk Assessment (Signed)
[facility] Admission Suicide Risk Assessment   Nursing information obtained from:    Demographic factors:    Current Mental Status:    Loss Factors:    Historical Factors:    Risk Reduction Factors:     Total Time spent with patient: 1 hour Principal Problem: Major depressive disorder, recurrent severe without psychotic features ([facility]) Diagnosis:   Patient Active Problem List   Diagnosis Date Noted  . Major depressive disorder, recurrent severe without psychotic features ([facility]) [F33.2] 01/27/2016  . Cannabis use disorder, moderate, dependence (HCC) [F12.20] 01/27/2016  . Tobacco use disorder [F17.200] 01/27/2016  . Amenorrhea [N91.2] 07/19/2015  . Hidradenitis suppurativa [L73.2] 12/21/2014  . Migraine without aura and without status migrainosus, not intractable [G43.009] 08/15/2013  . Obesity [E66.9] 07/02/2008  . RHINITIS, ALLERGIC [J30.9] 10/25/2006   Subjective Data: Depression, suicidal thoughts.  Continued Clinical Symptoms:  Alcohol Use Disorder Identification Test Final Score (AUDIT): 0 The "Alcohol Use Disorders Identification Test", Guidelines for Use in Primary Care, Second Edition.  World Science writer [facility]). Score between 0-7:  no or low risk or alcohol related problems. Score between 8-15:  moderate risk of alcohol related problems. Score between 16-19:  high risk of alcohol related problems. Score 20 or above:  warrants further diagnostic evaluation for alcohol dependence and treatment.   CLINICAL FACTORS:   Depression:   Impulsivity   Musculoskeletal: Strength & Muscle Tone: within normal limits Gait & Station: normal Patient leans: N/A  Psychiatric Specialty Exam: Physical Exam  Nursing note and vitals reviewed.   Review of Systems  Psychiatric/Behavioral: Positive for depression.  All other systems reviewed and are negative.   Blood pressure 119/64, pulse 71, temperature 98.3 F (36.8 C), temperature source Oral, resp. rate 18, height  (1.626  m), weight 126.1 kg (278 lb), last menstrual period 12/27/2015, SpO2 100 %.Body mass index is 47.7 kg/(m^2).  General Appearance: Casual  Eye Contact:  Good  Speech:  Clear and Coherent  Volume:  Normal  Mood:  Euthymic  Affect:  Appropriate  Thought Process:  Goal Directed  Orientation:  Full (Time, Place, and Person)  Thought Content:  WDL  Suicidal Thoughts:  No  Homicidal Thoughts:  No  Memory:  Immediate;   Fair Recent;   Fair Remote;   Fair  Judgement:  Fair  Insight:  Fair  Psychomotor Activity:  Normal  Concentration:  Concentration: Fair and Attention Span: Fair  Recall:  [employer] of Knowledge:  Fair  Language:  Fair  Akathisia:  No  Handed:  Right  AIMS (if indicated):     Assets:  Communication Skills Desire for Improvement Financial Resources/Insurance Housing Physical Health Resilience Social Support  ADL's:  Intact  Cognition:  WNL  Sleep:  Number of Hours: 6.3      COGNITIVE FEATURES THAT CONTRIBUTE TO RISK:  None    SUICIDE RISK:   Minimal: No identifiable suicidal ideation.  Patients presenting with no risk factors but with morbid ruminations; may be classified as minimal risk based on the severity of the depressive symptoms  PLAN OF CARE: Hospital admission, medication management, discharge planning.  Gloria Proctor is a 20 year old female with a history of mild depression and anxiety admitted for suicidal thinking in the context of relationship problems.  1. Suicidal ideation. The patient adamantly denies any thoughts, intentions, or plans to hurt herself or others. She is able to contract for safety. She is forward thinking and optimistic about the future. She is a loving daughter and a  sister.  2. Mood and anxiety. The patient does not want to be on medications but is ready to start psychotherapy.  3. Disposition. She will be discharged home with her mother. She will follow up with [facility].  I certify that inpatient services furnished can  reasonably be expected to improve the patient's condition.   Kristine LineaJolanta Phala Schraeder, MD 01/28/2016, 10:39 AM

## 2016-01-28 NOTE — B[facility] Counselor (Addendum)
Pt was admitted after the previous tx team mtg that the CSW was present for (on 01/27/16) and before the next scheduled tx meeting (on 02/01/16) and as such, a tx team note was not needed.  Pt also was admitted and discharged within a 24 hour period and as such, a PSA is not needed.  Dorothe PeaJonathan F. Gianah Batt, LCSWA, LCAS  01/28/16

## 2016-04-12 ENCOUNTER — Emergency Department (HOSPITAL_COMMUNITY)
Admission: EM | Admit: 2016-04-12 | Discharge: 2016-04-12 | Disposition: A | Discharge status: Home / Self Care | Payer: Medicaid Other | Attending: Emergency Medicine | Admitting: Emergency Medicine

## 2016-04-12 ENCOUNTER — Encounter (HOSPITAL_COMMUNITY): Payer: Self-pay | Admitting: Emergency Medicine

## 2016-04-12 DIAGNOSIS — F17210 Nicotine dependence, cigarettes, uncomplicated: Secondary | ICD-10-CM | POA: Diagnosis not present

## 2016-04-12 DIAGNOSIS — F410 Panic disorder [episodic paroxysmal anxiety] without agoraphobia: Secondary | ICD-10-CM | POA: Insufficient documentation

## 2016-04-12 LAB — POC URINE PREG, ED: Preg Test, Ur: NEGATIVE

## 2016-04-12 MED ORDER — HYDROXYZINE HCL 25 MG PO TABS
25.0000 mg | ORAL_TABLET | Freq: Once | ORAL | Status: AC
  Administered 2016-04-12: 25 mg via ORAL
  Filled 2016-04-12: qty 1

## 2016-04-12 MED ORDER — HYDROXYZINE HCL 25 MG PO TABS: 25.0000 mg | ORAL_TABLET | Freq: Three times a day (TID) | ORAL | 0 refills | Status: DC | PRN

## 2016-04-12 MED ORDER — ALPRAZOLAM 0.5 MG PO TABS: 0.5000 mg | ORAL_TABLET | Freq: Every evening | ORAL | 0 refills | Status: DC | PRN

## 2016-04-12 NOTE — ED Provider Notes (Signed)
[facility] DEPT Provider Note   CSN: [id number] Arrival date & time: 04/12/16  1249  By signing my name below, I, Gloria Proctor, attest that this documentation has been prepared under the direction and in the presence of Gloria BerryLeisa Hakeen Shipes, PA-C. Electronically Signed: Placido SouLogan Proctor, ED Scribe. 04/12/16. 1:28 PM.   History   Chief Complaint Chief Complaint  Patient presents with  . Panic Attack    HPI HPI Comments: Gloria Proctor is a 20 y.o. female with a PMHx of major depressive disorder, anxiety and panic attacks who presents to the Emergency Department complaining of worsening anxiety x 2 days. She states that she began feeling "nervous" similar to "butterflies in my stomach" 2 days ago which has progressively worsened. Today at work she began to feel "uncomfortable" and "my chest felt heavy" and began hyperventilating. She denies currently feeling anxious although she is still experiencing mild chest tightness. Pt denies she has had a panic attack for the past 2 years and typically would take xanax prn for panic attacks but denies she has been regularly evaluated for anxiety or takes any regular medications for her anxiety.  She denies a PMHx of asthma or a recent cough. She denies SOB, CP or other associated symptoms at this time.   Pt states since her most recent admission that she is doing much better. She denies currently taking any medications or having gone to therapy. She denies SI/HI, hallucinations or self injury.    The history is provided by the patient. No language interpreter was used.    Past Medical History:  Diagnosis Date  . Anxiety   . Depression   . Migraines     Patient Active Problem List   Diagnosis Date Noted  . Major depressive disorder, recurrent severe without psychotic features (HCC) 01/27/2016  . Cannabis use disorder, moderate, dependence (HCC) 01/27/2016  . Tobacco use disorder 01/27/2016  . Amenorrhea 07/19/2015  . Hidradenitis suppurativa  12/21/2014  . Migraine without aura and without status migrainosus, not intractable 08/15/2013  . Obesity 07/02/2008  . RHINITIS, ALLERGIC 10/25/2006    Past Surgical History:  Procedure Laterality Date  . MULTIPLE EXTRACTIONS WITH ALVEOLOPLASTY N/A 06/04/2015   Procedure: REMOVAL OF WISDOM TEETH;  Surgeon: Ocie DoyneScott Jensen, DDS;  Location: [facility] OR;  Service: Oral Surgery;  Laterality: N/A;  . TONSILLECTOMY AND ADENOIDECTOMY N/A 08/16/2013   Procedure: TONSILLECTOMY AND ADENOIDECTOMY;  Surgeon: Flo ShanksKarol Wolicki, MD;  Location: [facility] ORS;  Service: ENT;  Laterality: N/A;    OB History    No data available       Home Medications    Prior to Admission medications   Medication Sig Start Date End Date Taking? Authorizing Provider  ALPRAZolam Prudy Feeler(XANAX) 0.5 MG tablet Take 1 tablet (0.5 mg total) by mouth at bedtime as needed for anxiety. 04/12/16   Gloria BerryLeisa Eliud Polo, PA-C  fluticasone (FLONASE) 50 MCG/ACT nasal spray Place 1 spray into both nostrils daily. Patient not taking: Reported on 01/27/2016 09/27/15   Gloria Havensaleigh N Rumley, DO  hydrOXYzine (ATARAX/VISTARIL) 25 MG tablet Take 1 tablet (25 mg total) by mouth every 8 (eight) hours as needed for anxiety. 04/12/16   Gloria BerryLeisa Dylon Correa, PA-C    Family History Family History  Problem Relation Age of Onset  . Migraines Mother     Started having migraines during adulthood, after child birth  . Heart attack Paternal Grandfather     Social History Social History  Substance Use Topics  . Smoking status: Current Some Day Smoker  Types: Cigars    Start date: 03/28/2014  . Smokeless tobacco: Current User  . Alcohol use No    Allergies   Caffeine and Chocolate   Review of Systems Review of Systems  Respiratory: Positive for chest tightness. Negative for cough and shortness of breath.   Cardiovascular: Negative for chest pain.  Psychiatric/Behavioral: Negative for agitation, behavioral problems, confusion, hallucinations, self-injury and suicidal ideas. The  patient is nervous/anxious.   All other systems reviewed and are negative.  Physical Exam Updated Vital Signs BP 129/77 (BP Location: Right Arm)   Pulse 87   Temp 97.7 F (36.5 C) (Oral)   Resp 16   SpO2 100%   Physical Exam  Constitutional: She is oriented to person, place, and time. She appears well-developed and well-nourished. No distress.  HENT:  Head: Normocephalic and atraumatic.  Nose: Nose normal.  Mouth/Throat: Oropharynx is clear and moist.  Eyes: Conjunctivae are normal. Pupils are equal, round, and reactive to light. Right eye exhibits no discharge. Left eye exhibits no discharge. No scleral icterus.  Neck: Normal range of motion. No tracheal deviation present. No thyromegaly present.  Cardiovascular: Normal rate, regular rhythm, normal heart sounds and intact distal pulses.  Exam reveals no gallop and no friction rub.   No murmur heard. Pulmonary/Chest: Effort normal and breath sounds normal. No respiratory distress. She has no wheezes. She has no rales. She exhibits no tenderness.  Abdominal: Soft. Bowel sounds are normal. She exhibits no distension. There is no tenderness.  Musculoskeletal: Normal range of motion. She exhibits no deformity.  Neurological: She is alert and oriented to person, place, and time. She exhibits normal muscle tone. Coordination normal.  Skin: Skin is warm and dry. Capillary refill takes less than 2 seconds. No rash noted. She is not diaphoretic. No erythema. No pallor.  Psychiatric: She has a normal mood and affect. Her speech is normal and behavior is normal. Judgment and thought content normal.  Nursing note and vitals reviewed.  ED Treatments / Results  Labs (all labs ordered are listed, but only abnormal results are displayed) Labs Reviewed  POC URINE PREG, ED    EKG  EKG Interpretation None       Radiology No results found.  Procedures Procedures  DIAGNOSTIC STUDIES: Oxygen Saturation is 100% on RA, normal by my  interpretation.    COORDINATION OF CARE: 1:25 PM Discussed next steps with pt. Pt verbalized understanding and is agreeable with the plan.    Medications Ordered in ED Medications  hydrOXYzine (ATARAX/VISTARIL) tablet 25 mg (25 mg Oral Given 04/12/16 1340)     Initial Impression / Assessment and Plan / ED Course  I have reviewed the triage vital signs and the nursing notes.  Pertinent labs & imaging results that were available during my care of the patient were reviewed by me and considered in my medical decision making (see chart for details).  Clinical Course   Patient presents to the emergency department complaining of symptoms consistent with anxiety.  Patient has a history of same with similar episodes.  The patient is resting comfortably, in no apparent distress and asymptomatic.  Labs, ECG and vital signs reviewed.  No exophthalmos, pregnancy test negative.  Pt denies SI, HI, AVH.  Stress reducing mechanisms discussed including caffeine intake.  Patient has been referred to psychiatric services for follow-up.  Discharged with a 3 day prescription for Xanax 0.5 mg, pt records verified that she was previously prescribed this for panic attacks, last in 2015.  I personally performed the services described in this documentation, which was scribed in my presence. The recorded information has been reviewed and is accurate.    Final Clinical Impressions(s) / ED Diagnoses   Final diagnoses:  Panic attack    New Prescriptions Discharge Medication List as of 04/12/2016  2:04 PM    START taking these medications   Details  ALPRAZolam (XANAX) 0.5 MG tablet Take 1 tablet (0.5 mg total) by mouth at bedtime as needed for anxiety., Starting Wed 04/12/2016, Print    hydrOXYzine (ATARAX/VISTARIL) 25 MG tablet Take 1 tablet (25 mg total) by mouth every 8 (eight) hours as needed for anxiety., Starting Wed 04/12/2016, Print         Gloria BerryLeisa Nygeria Lager, PA-C 04/15/16 0202    Mancel BaleElliott Wentz,  MD 04/17/16 2119

## 2016-04-12 NOTE — Discharge Instructions (Signed)
Substance Abuse Treatment Programs ° °Intensive Outpatient Programs °[facility]     °[address]      °[place], [place]                   °[phone]      ° °[facility] °[address] °[place], [place] °[phone] ° °[facility]     °(Inpatient and outpatient)     °[address]           °[phone]   ° °[facility] °336-288-1484 (Suboxone and Methadone) ° °[address]      °[place], [place] [address]      °336-882-2125      ° °[address] °[place], [place] °852-3033 ° °[facility] (Outpatient/Inpatient, [facility])    °(insurance only) [phone]      °       °[facility] (Groups & Residential) °[place], [place] °336-389-1413 ° °   °[facility]     °[address]     °[place], [place]      °[phone]      ° °[facility] (for caregivers and family) °[address] °[place], [place] °336-299-4655 ° ° ° ° ° °Residential Treatment Programs °[facility]      °[address], [place], [place] [address]  °([phone]      ° °[facility] °[address] [place], [place] [address] °[phone] ° °[facility]        °336-248-8914      ° °[facility] °1-800-659-3381 ° °[facility]             °[address]                                         °[place], [place]                                                °877-615-2722 or 336-784-9470                              ° °[facility] [place] °[address] °[place], [address] °[phone] ° °[facility]    °[address]      °[place], [place]     °[phone]      ° °[facility] °[address] °[place], [place] °336-285-9073 ° °[facility]   °[address]     °[place], [place] [address]     °[phone]      °Admissions: 8am-3pm M-F ° °[facility] °[address] °[place],  [place] °336-227-7417 ° °[facility]: Residential Program (90 Days)   °[place], [place]      °336-725-8389 or 800-758-6077    ° °[facility] °[place], [place] °(Walk in Hours over the weekend or by referral) ° °[facility] °[address], [place], [place] [address] °([phone] ° °Crisis Mobile: [facility]:  1-877-626-1772 (for crisis response 24 hours a day) °[facility] Hotline:      1-800-256-2452 °Outpatient Psychiatry and Counseling ° °[facility]: Mobile Crisis   Management 24 hours:  1-877-626-1772 ° °[facility] sliding scale fee and walk in schedule: M-F 8am-12pm/1pm-3pm °[address]  °[place], [place] [address] °[phone] ° °[facility] °[address] °[place], [place] [address] °336-703-9650 ° °[facility] ([facility])- new patient walk-in appointments available Monday - Friday 8am -3pm.          °[address] °[place], [place] [address] °336-676-6840 or crisis line- 336-676-6905 ° °[facility] Intensive Outpatient Therapy Program °[address] °[place], [place] [address] °336-832-9804 ° °[facility]                  °Crisis Services      °[phone]      °[address]     °[place], [place] [address]                ° °[facility]   °[facility] °[phone] °[address] °[place], [place] [address] ° ° °[facility]          °[address]  °[place], [place] [address]       °([phone] ° °[facility] °[address] °[place], [place] [address] °336-292-1510 ° °[facility]    °[address]    °[place], [place] [address]     °336-632-3505      ° °Parish McKinney, MD     °[address]     °[place] [address]     °336-282-1251     °  °[facility] °[address] °[place] [address] ° °[facility]     °[address]     °[place], [place]      °336-542-2076      ° °[facility] °Shamsher Ahluwalia, MD °[address] °[place], [place] [address] °336-420-9558 ° °[facility]     °[address]     °[place], [place] [address]     °336-274-1237      ° °[facility] °[address] °[place], [place] [address] °336-854-4450 °Resources for Temporary Residential Assistance/Crisis Centers ° °DAY CENTERS °[facility] °M-F 8am-3pm   °[address] [place], [place] [address]   336-332-0824 °Services include: laundry, barbering, support groups, case management, phone  & computer access, showers, AA/NA mtgs, mental health/substance abuse nurse, job skills class, disability information, VA assistance, spiritual classes, etc.  ° °HOMELESS SHELTERS ° °[facility]     °[facility]   °[address], [place]     °336.271.5959       °       °[facility] (women and children)       °[address] °[place], [place] [address] °336-275-0820 °[email or web address] for application and process °Application Required ° °[facility]   °[address]    °[place] [address]     °[phone]       °             °[facility] °[address] °[place], [place] [address] °336.273.5572 °336-235-0363(schedule application appt.) °Application Required ° °[facility] (women only)    °[address]     °[place],  [address]     °336-884-1039      °  Intake starts 6pm daily °Need valid ID, SSC, & Police report °[facility] [place] °[address] °[place], [place] °336-881-5420 °Application Required ° °[facility] (men only)     °[address]      °[place], [place]     °[phone]      ° °[facility] °(Pregnant women only) °[address] °[place], [place] °336-275-0206 ° °[facility]      °[address]      °[place], [place] [address]     °[phone]      °       °[facility] °[address] °[place], [place] °336-723-1848 °90 day commitment/SA/Application process ° °[facility](men only)     °[address]     °[place], [place]     °[phone]       °Check-in at 7pm     °       °[facility] °[address] °[place], [place] [address] °336-248-6684 °Men/Women/Women and Children must be there by 7 pm ° °[facility] °[place], [place] °336-722-8721                ° °

## 2016-04-12 NOTE — ED Triage Notes (Signed)
Pt has hx fo anxiety with panic attacks, pt sts she has been nervous for the past two days and then today at work she all of the sudden became very anxious and began hyperventilating. Pt sts there was no inciting event. Pt sts she feels better now but is still breathing shallowly and is tearful in triage. Pt used to be prescribed xanax for her panic attacks but had stopped taking them a couple of years ago because she was doing better.

## 2016-05-09 NOTE — Progress Notes (Deleted)
Subjective:     Patient ID: Gloria Proctor, female   DOB: 03/26/1996, 20 y.o.   MRN: [id number]  HPI Gloria Proctor is a [place]20yo female presenting today with concerns for infertility. - History of Nexplanon placement in 07/2014. Removed in 12/2014 due to weight gain.  - Menstrual History:  Was amenorrheic for 2+ years due to depo and then Nexplanon. First menstrual period in 09/2015 following Nexplanon removal.  - Obstetric History: - Abdominal Surgeries: - STDs: - Frequency of Coitus: - Family History of Infertility: - Recent behavioral health hospitalization from 6/1-01/28/2016 for suicidal ideation after finding out her boyfriend of 9months had been unfaithful. - Social History:  Review of Systems     Objective:   Physical Exam     Assessment:     ***    Plan:     Infertility= inability of a couple to conceive after 12months of regular intercourse without use of contraception

## 2016-05-10 ENCOUNTER — Ambulatory Visit: Payer: Self-pay | Admitting: Family Medicine

## 2016-05-17 ENCOUNTER — Ambulatory Visit (INDEPENDENT_AMBULATORY_CARE_PROVIDER_SITE_OTHER): Payer: Medicaid Other | Admitting: Family Medicine

## 2016-05-17 ENCOUNTER — Encounter: Payer: Self-pay | Admitting: Family Medicine

## 2016-05-17 VITALS — BP 127/75 | HR 88 | Temp 98.5°F | Ht 64.0 in | Wt 265.4 lb

## 2016-05-17 DIAGNOSIS — N926 Irregular menstruation, unspecified: Secondary | ICD-10-CM | POA: Diagnosis not present

## 2016-05-17 DIAGNOSIS — Z3169 Encounter for other general counseling and advice on procreation: Secondary | ICD-10-CM

## 2016-05-17 NOTE — Patient Instructions (Signed)
Thank you so much for coming to visit today! We have two options given your concerns. Since you are not actively trying to become present and are not currently diagnosed with being infertile, we can give this more time and work on what you should work on to have a healthy baby when the time comes. This includes working on weight through diet and exercise and quitting smoking. If you are still interested in pursuing a possible infertility workup, I would recommend going to see an Ob/Gyn. We can place this referral if you are interested.  Dr. Caroleen Hamman  Infertility Infertility is when you are unable to get pregnant (conceive) after a year of having sex regularly without using birth control. Infertility can also mean that a woman is not able to carry a pregnancy to full term.  Both women and men can have fertility problems. WHAT CAUSES INFERTILITY? What Causes Infertility in Women? There are many possible causes of infertility in women. For some women, the cause of infertility is not known (unexplained infertility). Infertility can also be linked to more than one cause. Infertility problems in women can be caused by problems with the menstrual cycle or reproductive organs, certain medical conditions, and factors related to lifestyle and age.  Problems with your menstrual cycle can interfere with your ovaries producing eggs (ovulation). This can make it difficult to get pregnant. This includes having a menstrual cycle that is very long, very short, or irregular.  Problems with reproductive organs can include:  An abnormally narrow cervix or a cervix that does not remain closed during a pregnancy.  A blockage in your fallopian tubes.  An abnormally shaped uterus.  Uterine fibroids. This is a tissue mass (tumor) that can develop on your uterus.  Medical conditions that can affect a woman's fertility include:  Polycystic ovarian syndrome (PCOS). This is a hormonal disorder that can cause small cysts  to grow on your ovaries. This is the most common cause of infertility in women.  Endometriosis. This is a condition in which the tissue that lines your uterus (endometrium) grows outside of its normal location.  Primary ovary insufficiency. This is when your ovaries stop producing eggs and hormones before the age of 26.  Sexually transmitted diseases, such as chlamydia or gonorrhea. These infections can cause scarring in your fallopian tubes. This makes it difficult for eggs to reach your uterus.  Autoimmune disorders. These are disorders in which your immune system attacks normal, healthy cells.  Hormone imbalances.  Other factors include:  Age. A woman's fertility declines with age, especially after her mid-30s.  Being under- or overweight.  Drinking too much alcohol.  Using drugs.  Exercising excessively.  Being exposed to environmental toxins, such as radiation, pesticides, and certain chemicals. What Causes Infertility in Men? There are many causes of infertility in men. Infertility can be linked to more than one cause. Infertility problems in men can be caused by problems with sperm or the reproductive organs, certain medical conditions, and factors related to lifestyle and age. Some men have unexplained infertility.   Problems with sperm. Infertility can result if there is a problem producing:  Enough sperm (low sperm count).  Enough normally-shaped sperm (sperm morphology).  Sperm that are able to reach the egg (poor motility).  Infertility can also be caused by:  A problem with hormones.  Enlarged veins (varicoceles), cysts (spermatoceles), or tumors of the testicles.  Sexual dysfunction.  Injury to the testicles.  A birth defect, such as not having the  tubes that carry sperm (vas deferens).  Medical conditions that can affect a man's fertility include:  Diabetes.  Cancer treatments, such as chemotherapy or radiation.  Klinefelter syndrome. This is an  inherited genetic disorder.   Thyroid problems, such as an under- or overactive thyroid.  Cystic fibrosis.  Sexually transmitted diseases.  Other factors include:  Age. A man's fertility declines with age.  Drinking too much alcohol.  Using drugs.  Being exposed to environmental toxins, such as pesticides and lead. WHAT ARE THE SYMPTOMS OF INFERTILITY? Being unable to get pregnant after one year of having regular sex without using birth control is the only sign of infertility.  HOW IS INFERTILITY DIAGNOSED? In order to be diagnosed with infertility, both partners will have a physical exam. Both partners will also have an extensive medical and sexual history taken. If there is no obvious reason for infertility, additional tests may be done. What Tests Will Women Have? Women may first have tests to check whether they are ovulating each month. The tests may include:  Blood tests to check hormone levels.  An ultrasound of the ovaries. This looks for possible problems on or in the ovaries.  Taking a small sample of the tissue that lines the uterus for examination under a microscope (endometrial biopsy). Women who are ovulating may have additional tests. These may include:  Hysterosalpingography.  This is an X-ray of the fallopian tubes and uterus taken after a specific type of dye is injected.  This test can show the shape of the uterus and whether the fallopian tubes are open.  Laparoscopy.  In this test, a lighted tube (laparoscope) is used to look for problems in the fallopian tubes and other female organs.  Transvaginal ultrasound.  This is an imaging test to check for abnormalities of the uterus and ovaries.  A health care provider can use this test to count the number of follicles on the ovaries.  Hysteroscopy.  This test involves using a lighted tube to examine the cervix and inside the uterus.  It is done to find any abnormalities inside the uterus. What Tests  Will Men Have? Tests for men's infertility includes:  Semen tests to check sperm count, morphology, and motility.  Blood tests to check for hormone levels.  Taking a small sample of tissue from inside a testicle (biopsy). This is examined under a microscope.  Blood tests to check for genetic abnormalities (genetic testing). HOW ARE WOMEN TREATED FOR INFERTILITY?  Treatment depends on the cause of infertility. Most cases of infertility in women are treated with medicine or surgery.  Women may take medicine to:  Correct ovulation problems.  Treat other health conditions, such as PCOS.  Surgery may be done to:  Repair damage to the ovaries, fallopian tubes, cervix, or uterus.  Remove growths from the uterus.  Remove scar tissue from the uterus, pelvis, or other female organs. HOW ARE MEN TREATED FOR INFERTILITY?  Treatment depends on the cause of infertility. Most cases of infertility in men are treated with medicine or surgery.   Men may take medicine to:  Correct hormone problems.  Treat other health conditions.  Treat sexual dysfunction.  Surgery may be done to:  Remove blockages in the reproductive tract.  Correct other structural problems of the reproductive tract. WHAT IS ASSISTED REPRODUCTIVE TECHNOLOGY? Assisted reproductive technology (ART) refers to all treatments and procedures that combine eggs and sperm outside the body to try to help a couple conceive. ART is often combined with fertility  drugs to stimulate ovulation. Sometimes ART is done using eggs retrieved from another woman's body (donor eggs) or from previously frozen fertilized eggs (embryos).  There are different types of ART. These include:   Intrauterine insemination (IUI).  In this procedure, sperm is placed directly into a woman's uterus with a long, thin tube.  This may be most effective for infertility caused by sperm problems, including low sperm count and low motility.  Can be used in  combination with fertility drugs.  In vitro fertilization (IVF).  This is often done when a woman's fallopian tubes are blocked or when a man has low sperm counts.  Fertility drugs stimulate the ovaries to produce multiple eggs. Once mature, these eggs are removed from the body and combined with the sperm to be fertilized.  These fertilized eggs are then placed in the woman's uterus.   This information is not intended to replace advice given to you by your health care provider. Make sure you discuss any questions you have with your health care provider.   Document Released: 08/17/2003 Document Revised: 05/05/2015 Document Reviewed: 04/29/2014 Elsevier Interactive Patient Education [employer]

## 2016-05-17 NOTE — Progress Notes (Signed)
Subjective:     Patient ID: Gloria Proctor, female   DOB: 1995-10-14, 20 y.o.   MRN: [id number]  HPI Mrs. Gloria Proctor is a 20 year old female presenting today to discuss concerns for infertility. -Reports she had tried previously to get pregnant for 4-5 months. During this time she had actual intercourse without protection daily. States since that time she has broken up with her boyfriend and is no longer trying to get get pregnant. -Reports she has had 2 sisters that were told they were infertile. First sister was told she could not have kids however she proceeded to have 1 child naturally. Second sister was told she could not have kids due to her weight.  -Not currently on birth control. Reports in 2015 she tried the Depo shots which resulted in weight gain. In December 2015 she had a Nexplanon placed; this was removed in May 2016. -Has noted irregular menstrual cycles. Periods are 3-8 weeks apart. -Denies any history of STDs. -Denies any history of abdominal surgeries -Reports history of smoking -[facility]  Review of Systems Per history of present illness    Objective:   Physical Exam  Constitutional: She appears well-developed and well-nourished. No distress.  HENT:  Head: Normocephalic and atraumatic.  Cardiovascular: Normal rate and regular rhythm.   No murmur heard. Pulmonary/Chest: Effort normal. No respiratory distress. She has no wheezes.  Abdominal: Soft. She exhibits no distension. There is no tenderness.  Obese.  Musculoskeletal: She exhibits no edema.  Psychiatric: She has a normal mood and affect. Her behavior is normal.      Assessment and Plan:     Irregular Menstrual Cycle: -Suspect this is correlated with difficulties getting pregnant. Discussed that she cannot yet be diagnoses infertile since she only tried for 4-5 months and set of the required 12 months for diagnosis. -Recommended improving her current lifestyle so  that when it is time for her to become pregnant she is more likely to have a healthy baby. Recommended smoking cessation, weight loss with diet and exercise, discontinuing THC use. -Requests referral to OB/GYN to discuss irregular menstrual cycle and possibility of infertility. Referral placed. -Follow-up as needed.

## 2016-05-31 ENCOUNTER — Emergency Department (HOSPITAL_COMMUNITY): Payer: Medicaid Other

## 2016-05-31 ENCOUNTER — Encounter (HOSPITAL_COMMUNITY): Payer: Self-pay | Admitting: *Deleted

## 2016-05-31 ENCOUNTER — Emergency Department (HOSPITAL_COMMUNITY)
Admission: EM | Admit: 2016-05-31 | Discharge: 2016-05-31 | Disposition: A | Discharge status: Home / Self Care | Payer: Medicaid Other | Attending: Emergency Medicine | Admitting: Emergency Medicine

## 2016-05-31 DIAGNOSIS — J069 Acute upper respiratory infection, unspecified: Secondary | ICD-10-CM | POA: Diagnosis not present

## 2016-05-31 DIAGNOSIS — F17210 Nicotine dependence, cigarettes, uncomplicated: Secondary | ICD-10-CM | POA: Insufficient documentation

## 2016-05-31 DIAGNOSIS — B9789 Other viral agents as the cause of diseases classified elsewhere: Secondary | ICD-10-CM

## 2016-05-31 DIAGNOSIS — R05 Cough: Secondary | ICD-10-CM | POA: Diagnosis present

## 2016-05-31 MED ORDER — BENZONATATE 100 MG PO CAPS: 100.0000 mg | ORAL_CAPSULE | Freq: Three times a day (TID) | ORAL | 0 refills | Status: DC

## 2016-05-31 NOTE — ED Provider Notes (Signed)
[facility] Provider Note   CSN: [id number] Arrival date & time: 05/31/16  1025     History   Chief Complaint Chief Complaint  Patient presents with  . Cough    HPI Gloria Proctor is a 20 y.o. female.  The history is provided by the patient. No language interpreter was used.  Cough  This is a new problem. The current episode started more than 2 days ago. The problem occurs constantly. The problem has been gradually worsening. The cough is non-productive. The maximum temperature recorded prior to her arrival was 100 to 100.9 F. Pertinent negatives include no chest pain and no shortness of breath. She has tried nothing for the symptoms. The treatment provided no relief. She is a smoker. Her past medical history does not include bronchitis.    Past Medical History:  Diagnosis Date  . Anxiety   . Depression   . Migraines     Patient Active Problem List   Diagnosis Date Noted  . Major depressive disorder, recurrent severe without psychotic features (HCC) 01/27/2016  . Cannabis use disorder, moderate, dependence (HCC) 01/27/2016  . Tobacco use disorder 01/27/2016  . Amenorrhea 07/19/2015  . Hidradenitis suppurativa 12/21/2014  . Migraine without aura and without status migrainosus, not intractable 08/15/2013  . Obesity 07/02/2008  . RHINITIS, ALLERGIC 10/25/2006    Past Surgical History:  Procedure Laterality Date  . MULTIPLE EXTRACTIONS WITH ALVEOLOPLASTY N/A 06/04/2015   Procedure: REMOVAL OF WISDOM TEETH;  Surgeon: Ocie Doyne, DDS;  Location: [facility] OR;  Service: Oral Surgery;  Laterality: N/A;  . TONSILLECTOMY AND ADENOIDECTOMY N/A 08/16/2013   Procedure: TONSILLECTOMY AND ADENOIDECTOMY;  Surgeon: Flo Shanks, MD;  Location: [facility];  Service: ENT;  Laterality: N/A;    OB History    No data available       Home Medications    Prior to Admission medications   Medication Sig Start Date End Date Taking? Authorizing Provider  ALPRAZolam Prudy Feeler) 0.5 MG  tablet Take 1 tablet (0.5 mg total) by mouth at bedtime as needed for anxiety. 04/12/16   Danelle Berry, PA-C  benzonatate (TESSALON) 100 MG capsule Take 1 capsule (100 mg total) by mouth every 8 (eight) hours. 05/31/16   Elson Areas, PA-C  fluticasone (FLONASE) 50 MCG/ACT nasal spray Place 1 spray into both nostrils daily. Patient not taking: Reported on 01/27/2016 09/27/15   Lora Havens Rumley, DO  hydrOXYzine (ATARAX/VISTARIL) 25 MG tablet Take 1 tablet (25 mg total) by mouth every 8 (eight) hours as needed for anxiety. 04/12/16   Danelle Berry, PA-C    Family History Family History  Problem Relation Age of Onset  . Migraines Mother     Started having migraines during adulthood, after child birth  . Heart attack Paternal Grandfather     Social History Social History  Substance Use Topics  . Smoking status: Current Some Day Smoker    Types: Cigars    Start date: 03/28/2014  . Smokeless tobacco: Current User  . Alcohol use No     Allergies   Caffeine and Chocolate   Review of Systems Review of Systems  Respiratory: Positive for cough. Negative for shortness of breath.   Cardiovascular: Negative for chest pain.  All other systems reviewed and are negative.    Physical Exam Updated Vital Signs BP 123/70 (BP Location: Right Arm)   Pulse 86   Temp 97.9 F (36.6 C) (Oral)   Resp 20   LMP 05/20/2016   SpO2 98%  Physical Exam  Constitutional: She appears well-developed and well-nourished. No distress.  HENT:  Head: Normocephalic and atraumatic.  Right Ear: External ear normal.  Left Ear: External ear normal.  Eyes: Conjunctivae are normal.  Neck: Neck supple.  Cardiovascular: Normal rate and regular rhythm.   No murmur heard. Pulmonary/Chest: Effort normal and breath sounds normal. No respiratory distress. She has no wheezes. She exhibits no tenderness.  Abdominal: Soft. There is no tenderness.  Musculoskeletal: She exhibits no edema.  Neurological: She is alert.  Skin:  Skin is warm and dry.  Psychiatric: She has a normal mood and affect.  Nursing note and vitals reviewed.    ED Treatments / Results  Labs (all labs ordered are listed, but only abnormal results are displayed) Labs Reviewed - No data to display  EKG  EKG Interpretation None       Radiology Dg Chest 2 View  Result Date: 05/31/2016 CLINICAL DATA:  Cough for 3 days. EXAM: CHEST  2 VIEW COMPARISON:  None. FINDINGS: The heart size and mediastinal contours are within normal limits. Both lungs are clear. The visualized skeletal structures are unremarkable. IMPRESSION: No active cardiopulmonary disease. Electronically Signed   By: Charlett NoseKevin  Dover M.D.   On: 05/31/2016 11:41    Procedures Procedures (including critical care time)  Medications Ordered in ED Medications - No data to display   Initial Impression / Assessment and Plan / ED Course  I have reviewed the triage vital signs and the nursing notes.  Pertinent labs & imaging results that were available during my care of the patient were reviewed by me and considered in my medical decision making (see chart for details).  Clinical Course      Final Clinical Impressions(s) / ED Diagnoses   Final diagnoses:  Viral URI with cough    New Prescriptions Discharge Medication List as of 05/31/2016 11:56 AM    START taking these medications   Details  benzonatate (TESSALON) 100 MG capsule Take 1 capsule (100 mg total) by mouth every 8 (eight) hours., Starting Wed 05/31/2016, Print         Lonia SkinnerLeslie K TroxelvilleSofia, PA-C 05/31/16 1646    Raeford RazorStephen Kohut, MD 06/05/16 [phone]

## 2016-05-31 NOTE — ED Notes (Signed)
Pt is in stable condition upon d/c and ambulates from ED. 

## 2016-05-31 NOTE — ED Triage Notes (Signed)
Pt arrives with c/o of a productive cough x3 days. Pt states she is having yellow expectorant and has tried OTC allergy medicine with no relief, pt denies using cough meds.

## 2016-05-31 NOTE — ED Notes (Signed)
Patient transported to X-ray 

## 2016-06-19 ENCOUNTER — Ambulatory Visit (HOSPITAL_COMMUNITY)
Admission: EM | Admit: 2016-06-19 | Discharge: 2016-06-19 | Disposition: A | Discharge status: Home / Self Care | Payer: Medicaid Other | Attending: Family Medicine | Admitting: Family Medicine

## 2016-06-19 ENCOUNTER — Encounter (HOSPITAL_COMMUNITY): Payer: Self-pay | Admitting: Emergency Medicine

## 2016-06-19 DIAGNOSIS — B009 Herpesviral infection, unspecified: Secondary | ICD-10-CM | POA: Diagnosis not present

## 2016-06-19 MED ORDER — VALACYCLOVIR HCL 500 MG PO TABS: 500.0000 mg | ORAL_TABLET | Freq: Two times a day (BID) | ORAL | 1 refills | Status: DC

## 2016-06-19 NOTE — ED Provider Notes (Signed)
[facility]    CSN: [id number] Arrival date & time: 06/19/16  1024     History   Chief Complaint Chief Complaint  Patient presents with  . Facial Swelling    HPI Gloria Proctor is a 20 y.o. female.   This is a 20 year old woman who presents with facial swelling after having been seen for a coughing illness 19 days ago in the emergency room. The swelling began on the upper left this morning.  Patient has had cold sores in the past with the similar kind of swelling although this is a little bit more and she doesn't have the fever blisters yet  Patient works in a nursing home.      Past Medical History:  Diagnosis Date  . Anxiety   . Depression   . Migraines     Patient Active Problem List   Diagnosis Date Noted  . Major depressive disorder, recurrent severe without psychotic features (HCC) 01/27/2016  . Cannabis use disorder, moderate, dependence (HCC) 01/27/2016  . Tobacco use disorder 01/27/2016  . Amenorrhea 07/19/2015  . Hidradenitis suppurativa 12/21/2014  . Migraine without aura and without status migrainosus, not intractable 08/15/2013  . Obesity 07/02/2008  . RHINITIS, ALLERGIC 10/25/2006    Past Surgical History:  Procedure Laterality Date  . MULTIPLE EXTRACTIONS WITH ALVEOLOPLASTY N/A 06/04/2015   Procedure: REMOVAL OF WISDOM TEETH;  Surgeon: Ocie DoyneScott Jensen, DDS;  Location: [facility] OR;  Service: Oral Surgery;  Laterality: N/A;  . TONSILLECTOMY AND ADENOIDECTOMY N/A 08/16/2013   Procedure: TONSILLECTOMY AND ADENOIDECTOMY;  Surgeon: Flo ShanksKarol Wolicki, MD;  Location: [facility];  Service: ENT;  Laterality: N/A;    OB History    No data available       Home Medications    Prior to Admission medications   Medication Sig Start Date End Date Taking? Authorizing Provider  ALPRAZolam Prudy Feeler(XANAX) 0.5 MG tablet Take 1 tablet (0.5 mg total) by mouth at bedtime as needed for anxiety. 04/12/16   Danelle BerryLeisa Tapia, PA-C  fluticasone (FLONASE) 50 MCG/ACT nasal spray  Place 1 spray into both nostrils daily. Patient not taking: Reported on 01/27/2016 09/27/15   Lora Havensaleigh N Rumley, DO  hydrOXYzine (ATARAX/VISTARIL) 25 MG tablet Take 1 tablet (25 mg total) by mouth every 8 (eight) hours as needed for anxiety. 04/12/16   Danelle BerryLeisa Tapia, PA-C  valACYclovir (VALTREX) 500 MG tablet Take 1 tablet (500 mg total) by mouth 2 (two) times daily. 06/19/16   Elvina SidleKurt Rennie Hack, MD    Family History Family History  Problem Relation Age of Onset  . Migraines Mother     Started having migraines during adulthood, after child birth  . Heart attack Paternal Grandfather     Social History Social History  Substance Use Topics  . Smoking status: Current Some Day Smoker    Types: Cigars    Start date: 03/28/2014  . Smokeless tobacco: Current User  . Alcohol use No     Allergies   Caffeine and Chocolate   Review of Systems Review of Systems  Constitutional: Negative.   HENT: Negative for congestion, mouth sores, nosebleeds, postnasal drip, rhinorrhea and sore throat.   Respiratory: Negative.   Cardiovascular: Negative.      Physical Exam Triage Vital Signs ED Triage Vitals  Enc Vitals Group     BP 06/19/16 1051 113/65     Pulse Rate 06/19/16 1051 69     Resp 06/19/16 1051 20     Temp 06/19/16 1051 98.4 F (36.9 C)  Temp Source 06/19/16 1051 Oral     SpO2 06/19/16 1051 100 %     Weight --      Height --      Head Circumference --      Peak Flow --      Pain Score 06/19/16 1057 0     Pain Loc --      Pain Edu? --      Excl. in GC? --    No data found.   Updated Vital Signs BP 113/65 (BP Location: Right Arm)   Pulse 69   Temp 98.4 F (36.9 C) (Oral)   Resp 20   LMP 05/20/2016 (Exact Date)   SpO2 100%     Physical Exam  Constitutional: She is oriented to person, place, and time. She appears well-developed and well-nourished.  HENT:  Head: Atraumatic.  Right Ear: External ear normal.  Left Ear: External ear normal.  Upper lip is mildly swollen  with no skin break. Teeth are in good condition.  Eyes: Conjunctivae and EOM are normal. Pupils are equal, round, and reactive to light.  Neck: Normal range of motion. Neck supple.  Musculoskeletal: Normal range of motion.  Neurological: She is alert and oriented to person, place, and time.  Nursing note and vitals reviewed.    UC Treatments / Results  Labs (all labs ordered are listed, but only abnormal results are displayed) Labs Reviewed - No data to display  EKG  EKG Interpretation None       Radiology No results found.  Procedures Procedures (including critical care time)  Medications Ordered in UC Medications - No data to display   Initial Impression / Assessment and Plan / UC Course  I have reviewed the triage vital signs and the nursing notes.  Pertinent labs & imaging results that were available during my care of the patient were reviewed by me and considered in my medical decision making (see chart for details).  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     Elvina Sidle, MD 06/19/16 [phone]

## 2016-06-19 NOTE — ED Triage Notes (Signed)
[facility] with a complaint of swelling to her face and lips that she noticed when she woke up this am. The patient denied any known injuries or new medications.

## 2016-06-20 ENCOUNTER — Ambulatory Visit: Payer: Self-pay | Admitting: Family Medicine

## 2016-10-18 IMAGING — DX DG CHEST 2V
2 series · 2 of 2 positions shown · non-contrast
Comparison: None.

CLINICAL DATA: Cough for 3 days.

EXAM:
CHEST  2 VIEW

[chest pa]
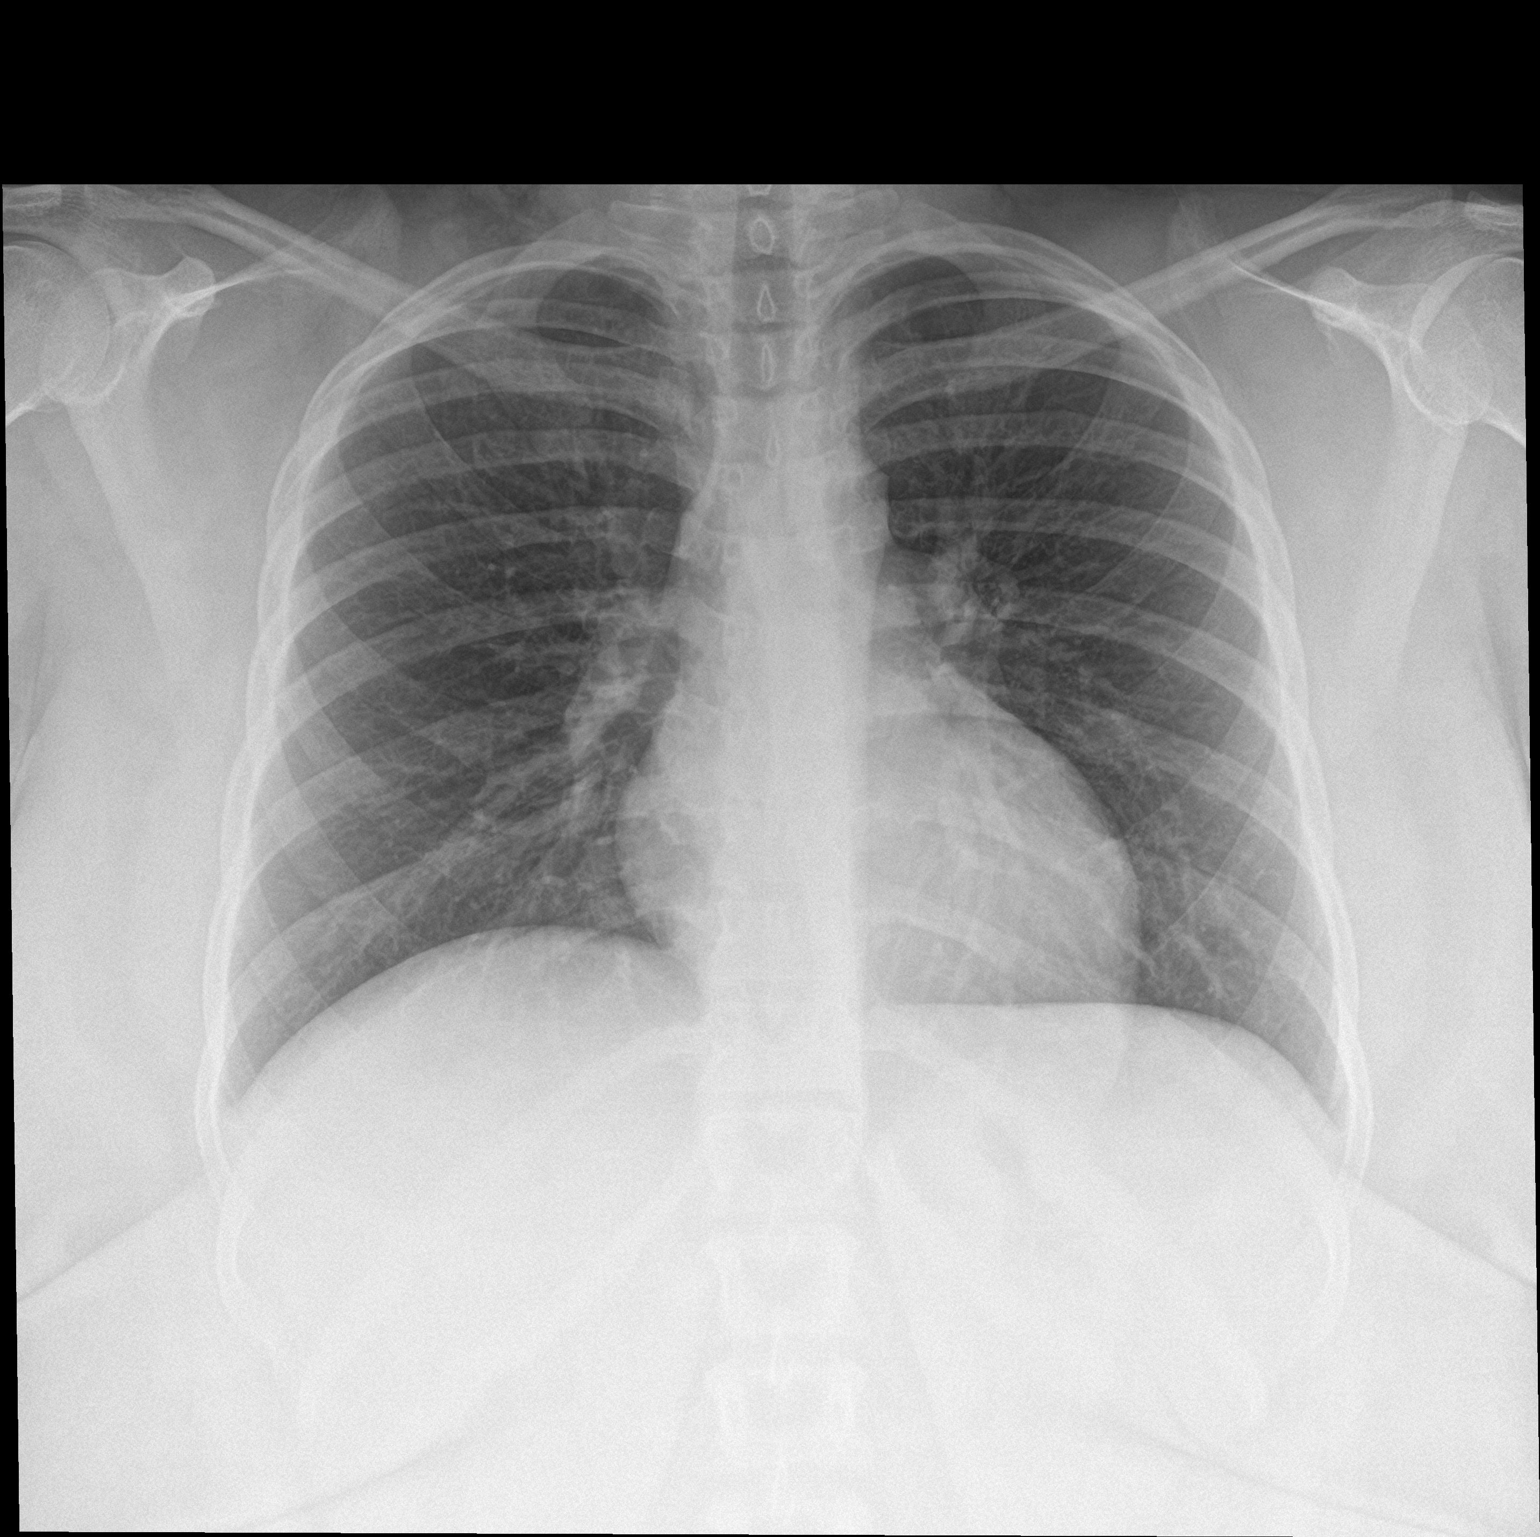

[chest lat]
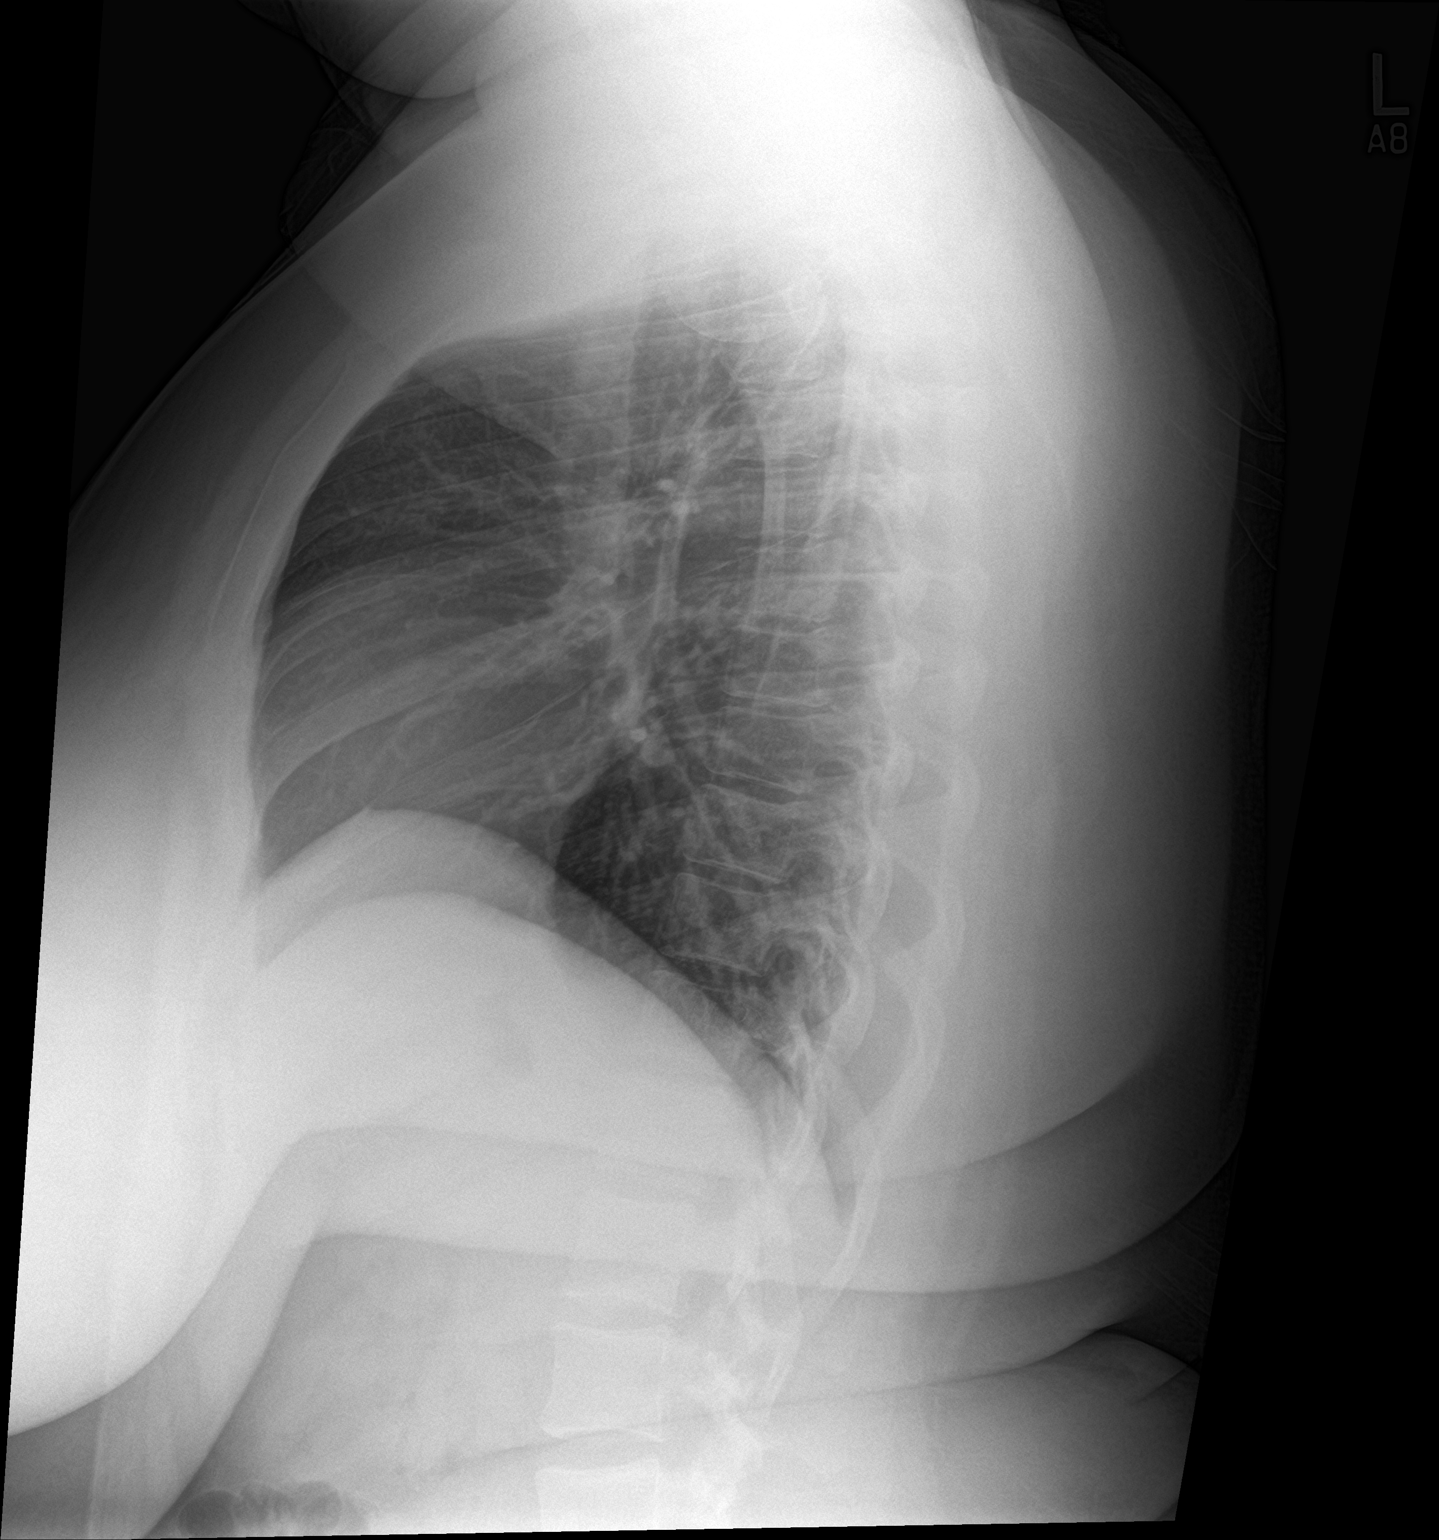

[2 of 2 positions shown; findings below may reference images not displayed]

FINDINGS: The heart size and mediastinal contours are within normal limits.
Both lungs are clear. The visualized skeletal structures are
unremarkable.
IMPRESSION: No active cardiopulmonary disease.

## 2016-11-07 ENCOUNTER — Ambulatory Visit (HOSPITAL_COMMUNITY)
Admission: EM | Admit: 2016-11-07 | Discharge: 2016-11-07 | Disposition: A | Discharge status: Home / Self Care | Payer: Medicaid Other | Attending: Internal Medicine | Admitting: Internal Medicine

## 2016-11-07 ENCOUNTER — Encounter (HOSPITAL_COMMUNITY): Payer: Self-pay | Admitting: Family Medicine

## 2016-11-07 DIAGNOSIS — F419 Anxiety disorder, unspecified: Secondary | ICD-10-CM | POA: Diagnosis not present

## 2016-11-07 MED ORDER — ALPRAZOLAM 0.5 MG PO TABS: 0.5000 mg | ORAL_TABLET | Freq: Every evening | ORAL | 0 refills | Status: DC | PRN

## 2016-11-07 MED ORDER — HYDROXYZINE HCL 25 MG PO TABS: 25.0000 mg | ORAL_TABLET | Freq: Three times a day (TID) | ORAL | 0 refills | Status: DC | PRN

## 2016-11-07 NOTE — Discharge Instructions (Signed)
Schedule to see your Physician forrecheck 

## 2016-11-07 NOTE — ED Provider Notes (Signed)
CSN: [id number]     Arrival date & time 11/07/16  1433 History   None    Chief Complaint  Patient presents with  . Anxiety   (Consider location/radiation/quality/duration/timing/severity/associated sxs/prior Treatment) The history is provided by the patient. No language interpreter was used.  Anxiety  This is a recurrent problem. The problem occurs constantly. The problem has not changed since onset.Nothing aggravates the symptoms. Nothing relieves the symptoms. She has tried nothing for the symptoms. The treatment provided no relief.   Pt is out of xanax and atarax. Pt tried to schedule app with family practice. Last rx 9 months ago Past Medical History:  Diagnosis Date  . Anxiety   . Depression   . Migraines    Past Surgical History:  Procedure Laterality Date  . MULTIPLE EXTRACTIONS WITH ALVEOLOPLASTY N/A 06/04/2015   Procedure: REMOVAL OF WISDOM TEETH;  Surgeon: Ocie DoyneScott Jensen, DDS;  Location: [facility] OR;  Service: Oral Surgery;  Laterality: N/A;  . TONSILLECTOMY AND ADENOIDECTOMY N/A 08/16/2013   Procedure: TONSILLECTOMY AND ADENOIDECTOMY;  Surgeon: Flo ShanksKarol Wolicki, MD;  Location: [facility];  Service: ENT;  Laterality: N/A;   Family History  Problem Relation Age of Onset  . Migraines Mother     Started having migraines during adulthood, after child birth  . Heart attack Paternal Grandfather    Social History  Substance Use Topics  . Smoking status: Current Some Day Smoker    Types: Cigars    Start date: 03/28/2014  . Smokeless tobacco: Current User  . Alcohol use No   OB History    No data available     Review of Systems  All other systems reviewed and are negative.   Allergies  Caffeine and Chocolate  Home Medications   Prior to Admission medications   Medication Sig Start Date End Date Taking? Authorizing Provider  ALPRAZolam Prudy Feeler(XANAX) 0.5 MG tablet Take 1 tablet (0.5 mg total) by mouth at bedtime as needed for anxiety. 11/07/16   Elson AreasLeslie K Kenneshia Rehm, PA-C  fluticasone  [facility](FLONASE) 50 MCG/ACT nasal spray Place 1 spray into both nostrils daily. Patient not taking: Reported on 01/27/2016 09/27/15   Lora Havensaleigh N Rumley, DO  hydrOXYzine (ATARAX/VISTARIL) 25 MG tablet Take 1 tablet (25 mg total) by mouth every 8 (eight) hours as needed for anxiety. 11/07/16   Elson AreasLeslie K Cerise Lieber, PA-C  valACYclovir (VALTREX) 500 MG tablet Take 1 tablet (500 mg total) by mouth 2 (two) times daily. 06/19/16   Elvina SidleKurt Lauenstein, MD   Meds Ordered and Administered this Visit  Medications - No data to display  BP 112/63   Pulse 70   Temp 98.1 F (36.7 C)   Resp 16   LMP 10/22/2016   SpO2 99%  No data found.   Physical Exam  Constitutional: She is oriented to person, place, and time. She appears well-developed and well-nourished.  HENT:  Head: Normocephalic.  Eyes: EOM are normal.  Neck: Normal range of motion.  Pulmonary/Chest: Effort normal.  Abdominal: She exhibits no distension.  Musculoskeletal: Normal range of motion.  Neurological: She is alert and oriented to person, place, and time.  Psychiatric: She has a normal mood and affect.  Nursing note and vitals reviewed.   Urgent Care Course     Procedures (including critical care time)  Labs Review Labs Reviewed - No data to display  Imaging Review No results found.   Visual Acuity Review  Right Eye Distance:   Left Eye Distance:   Bilateral Distance:    Right Eye  Near:   Left Eye Near:    Bilateral Near:         MDM Pt given limited amount for prn use.  Pt to call family practice to be seen   1. Anxiety    Meds ordered this encounter  Medications  . ALPRAZolam (XANAX) 0.5 MG tablet    Sig: Take 1 tablet (0.5 mg total) by mouth at bedtime as needed for anxiety.    Dispense:  6 tablet    Refill:  0    Order Specific Question:   Supervising Provider    Answer:   Linna Hoff [phone]  . hydrOXYzine (ATARAX/VISTARIL) 25 MG tablet    Sig: Take 1 tablet (25 mg total) by mouth every 8 (eight) hours as needed  for anxiety.    Dispense:  12 tablet    Refill:  0    Order Specific Question:   Supervising Provider    Answer:   Linna Hoff [phone]   An After Visit Summary was printed and given to the patient.     Lonia Skinner Crown, PA-C 11/07/16 2105

## 2016-11-07 NOTE — ED Triage Notes (Signed)
Pt here for anxiety attacks. sts that she used to take xanax and cant get in with her primary doctor.

## 2016-11-12 ENCOUNTER — Emergency Department (HOSPITAL_COMMUNITY)
Admission: EM | Admit: 2016-11-12 | Discharge: 2016-11-12 | Disposition: A | Discharge status: Home / Self Care | Payer: Medicaid Other | Attending: Emergency Medicine | Admitting: Emergency Medicine

## 2016-11-12 ENCOUNTER — Encounter (HOSPITAL_COMMUNITY): Payer: Self-pay | Admitting: Emergency Medicine

## 2016-11-12 DIAGNOSIS — F17290 Nicotine dependence, other tobacco product, uncomplicated: Secondary | ICD-10-CM | POA: Insufficient documentation

## 2016-11-12 DIAGNOSIS — Z79899 Other long term (current) drug therapy: Secondary | ICD-10-CM | POA: Diagnosis not present

## 2016-11-12 DIAGNOSIS — J069 Acute upper respiratory infection, unspecified: Secondary | ICD-10-CM | POA: Diagnosis not present

## 2016-11-12 DIAGNOSIS — J029 Acute pharyngitis, unspecified: Secondary | ICD-10-CM | POA: Diagnosis present

## 2016-11-12 LAB — RAPID STREP SCREEN (MED CTR MEBANE ONLY): Streptococcus, Group A Screen (Direct): NEGATIVE

## 2016-11-12 NOTE — ED Triage Notes (Signed)
Pt c/o sore throat, body aches and chills onset last night. 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

## 2016-11-12 NOTE — ED Provider Notes (Signed)
[facility]-EMERGENCY DEPT Provider Note   CSN: [id number] Arrival date & time: 11/12/16  [id number]  By signing my name below, I, Linna DarnerRussell Turner, attest that this documentation has been prepared under the direction and in the presence of [employer] MillsBenjamin Jenisa Monty, PA-C. Electronically Signed: Linna Darnerussell Turner, Scribe. 11/12/2016. 11:16 AM.  History   Chief Complaint Chief Complaint  Patient presents with  . Sore Throat  . Generalized Body Aches  . Chills    The history is provided by the patient. No language interpreter was used.     HPI Comments: Gloria Proctor is a 21 y.o. female who presents to the Emergency Department complaining of a constant sore throat beginning last night. She reports associated generalized body aches, chills, and an intermittent cough that is occasionally productive of sputum. She has not tried any medications for her symptoms, but has tried hot tea with mild improvement of her sore throat. Pt did not receive a flu vaccination this season. NKDA. She denies fever, abdominal pain, rashes, SOB, or any other associated symptoms.  Past Medical History:  Diagnosis Date  . Anxiety   . Depression   . Migraines     Patient Active Problem List   Diagnosis Date Noted  . Major depressive disorder, recurrent severe without psychotic features (HCC) 01/27/2016  . Cannabis use disorder, moderate, dependence (HCC) 01/27/2016  . Tobacco use disorder 01/27/2016  . Amenorrhea 07/19/2015  . Hidradenitis suppurativa 12/21/2014  . Migraine without aura and without status migrainosus, not intractable 08/15/2013  . Obesity 07/02/2008  . RHINITIS, ALLERGIC 10/25/2006    Past Surgical History:  Procedure Laterality Date  . MULTIPLE EXTRACTIONS WITH ALVEOLOPLASTY N/A 06/04/2015   Procedure: REMOVAL OF WISDOM TEETH;  Surgeon: Ocie DoyneScott Jensen, DDS;  Location: [facility] OR;  Service: Oral Surgery;  Laterality: N/A;  . TONSILLECTOMY AND ADENOIDECTOMY N/A 08/16/2013   Procedure: TONSILLECTOMY AND  ADENOIDECTOMY;  Surgeon: Flo ShanksKarol Wolicki, MD;  Location: [facility];  Service: ENT;  Laterality: N/A;    OB History    No data available       Home Medications    Prior to Admission medications   Medication Sig Start Date End Date Taking? Authorizing Provider  ALPRAZolam Prudy Feeler(XANAX) 0.5 MG tablet Take 1 tablet (0.5 mg total) by mouth at bedtime as needed for anxiety. 11/07/16   Elson AreasLeslie K Sofia, PA-C  fluticasone [facility](FLONASE) 50 MCG/ACT nasal spray Place 1 spray into both nostrils daily. Patient not taking: Reported on 01/27/2016 09/27/15   Lora Havensaleigh N Rumley, DO  hydrOXYzine (ATARAX/VISTARIL) 25 MG tablet Take 1 tablet (25 mg total) by mouth every 8 (eight) hours as needed for anxiety. 11/07/16   Elson AreasLeslie K Sofia, PA-C  valACYclovir (VALTREX) 500 MG tablet Take 1 tablet (500 mg total) by mouth 2 (two) times daily. 06/19/16   Elvina SidleKurt Lauenstein, MD    Family History Family History  Problem Relation Age of Onset  . Migraines Mother     Started having migraines during adulthood, after child birth  . Heart attack Paternal Grandfather     Social History Social History  Substance Use Topics  . Smoking status: Current Some Day Smoker    Types: Cigars    Start date: 03/28/2014  . Smokeless tobacco: Current User  . Alcohol use No     Allergies   Caffeine and Chocolate   Review of Systems Review of Systems  A complete review of systems was obtained and all systems are negative except as noted in the HPI and PMH.   Physical Exam  Updated Vital Signs BP 129/78 (BP Location: Right Arm)   Pulse 87   Temp 99.3 F (37.4 C) (Oral)   Resp 20   Ht 5\' 3"  (1.6 m)   Wt 120.2 kg   LMP 10/22/2016   SpO2 100%   BMI 46.94 kg/m   Physical Exam  Constitutional: She is oriented to person, place, and time. She appears well-developed and well-nourished. No distress.  HENT:  Head: Normocephalic and atraumatic.  Minimally erythematous posterior oropharynx. No unilateral tonsillar swelling, uvula is midline. No  trismus. Moist mucous membranes.  Eyes: Conjunctivae and EOM are normal.  Neck: Normal range of motion. Neck supple. No tracheal deviation present.  Cardiovascular: Normal rate, regular rhythm and normal heart sounds.   Pulmonary/Chest: Effort normal and breath sounds normal. No respiratory distress. She has no wheezes. She has no rales.  Lungs CTA bilaterally.  Musculoskeletal: Normal range of motion.  Lymphadenopathy:    She has cervical adenopathy.  Right-sided cervical adenopathy.  Neurological: She is alert and oriented to person, place, and time.  Skin: Skin is warm and dry.  Psychiatric: She has a normal mood and affect. Her behavior is normal.  Nursing note and vitals reviewed.   ED Treatments / Results  Labs (all labs ordered are listed, but only abnormal results are displayed) Labs Reviewed  RAPID STREP SCREEN (NOT AT [facility])  CULTURE, GROUP A STREP [facility])    EKG  EKG Interpretation None       Radiology No results found.  Procedures Procedures (including critical care time)  DIAGNOSTIC STUDIES: Oxygen Saturation is 100% on RA, normal by my interpretation.    COORDINATION OF CARE: 11:19 AM Discussed treatment plan with pt at bedside and pt agreed to plan.  Medications Ordered in ED Medications - No data to display   Initial Impression / Assessment and Plan / ED Course  I have reviewed the triage vital signs and the nursing notes.  Pertinent labs & imaging results that were available during my care of the patient were reviewed by me and considered in my medical decision making (see chart for details).     Symptoms likely secondary to viral etiology. Appears very well. Discussed continued supportive care at home with OTC medications, oral hydration. Rapid strep negative. Follow-up with PCP. Return precautions discussed  Final Clinical Impressions(s) / ED Diagnoses   Final diagnoses:  Viral upper respiratory tract infection    New Prescriptions New  Prescriptions   No medications on file    I personally performed the services described in this documentation, which was scribed in my presence. The recorded information has been reviewed and is accurate.    Joycie Peek, PA-C 11/12/16 1130    Mancel Bale, MD 11/12/16 [phone]

## 2016-11-12 NOTE — Discharge Instructions (Signed)
Continue with OTC medications, fluids, Tylenol and Motrin for body aches. He may also consider Cepacol lozenges for sore throat. Follow-up with your doctor. Return to ED for new or worsening symptoms.

## 2016-11-12 NOTE — ED Notes (Signed)
Pt ambulated to room from waiting area. Pt had no complaints.  

## 2016-11-13 ENCOUNTER — Ambulatory Visit: Payer: Medicaid Other | Admitting: Family Medicine

## 2016-11-14 LAB — CULTURE, GROUP A STREP (THRC)

## 2016-12-20 ENCOUNTER — Other Ambulatory Visit: Payer: Self-pay | Admitting: Family Medicine

## 2017-01-02 ENCOUNTER — Encounter: Payer: Self-pay | Admitting: Family Medicine

## 2017-01-16 ENCOUNTER — Encounter: Payer: Medicaid Other | Admitting: Family Medicine

## 2017-01-31 ENCOUNTER — Emergency Department (HOSPITAL_COMMUNITY)
Admission: EM | Admit: 2017-01-31 | Discharge: 2017-01-31 | Disposition: A | Discharge status: Home / Self Care | Payer: Self-pay | Attending: Emergency Medicine | Admitting: Emergency Medicine

## 2017-01-31 ENCOUNTER — Emergency Department (HOSPITAL_COMMUNITY): Payer: Self-pay

## 2017-01-31 ENCOUNTER — Encounter (HOSPITAL_COMMUNITY): Payer: Self-pay | Admitting: Emergency Medicine

## 2017-01-31 DIAGNOSIS — Z79899 Other long term (current) drug therapy: Secondary | ICD-10-CM | POA: Insufficient documentation

## 2017-01-31 DIAGNOSIS — A599 Trichomoniasis, unspecified: Secondary | ICD-10-CM | POA: Insufficient documentation

## 2017-01-31 DIAGNOSIS — N739 Female pelvic inflammatory disease, unspecified: Secondary | ICD-10-CM | POA: Insufficient documentation

## 2017-01-31 DIAGNOSIS — R197 Diarrhea, unspecified: Secondary | ICD-10-CM | POA: Insufficient documentation

## 2017-01-31 DIAGNOSIS — F17290 Nicotine dependence, other tobacco product, uncomplicated: Secondary | ICD-10-CM | POA: Insufficient documentation

## 2017-01-31 DIAGNOSIS — R102 Pelvic and perineal pain: Secondary | ICD-10-CM | POA: Insufficient documentation

## 2017-01-31 LAB — POC URINE PREG, ED: Preg Test, Ur: NEGATIVE

## 2017-01-31 LAB — URINALYSIS, ROUTINE W REFLEX MICROSCOPIC
BILIRUBIN URINE: NEGATIVE
GLUCOSE, UA: NEGATIVE mg/dL
Hgb urine dipstick: NEGATIVE
Ketones, ur: NEGATIVE mg/dL
Nitrite: NEGATIVE
PH: 5 (ref 5.0–8.0)
Protein, ur: NEGATIVE mg/dL
SPECIFIC GRAVITY, URINE: 1.025 (ref 1.005–1.030)

## 2017-01-31 LAB — CBC WITH DIFFERENTIAL/PLATELET
BASOS ABS: 0 10*3/uL (ref 0.0–0.1)
BASOS PCT: 0 %
EOS ABS: 0.1 10*3/uL (ref 0.0–0.7)
EOS PCT: 1 %
HCT: 39.6 % (ref 36.0–46.0)
Hemoglobin: 12.9 g/dL (ref 12.0–15.0)
Lymphocytes Relative: 32 %
Lymphs Abs: 1.8 10*3/uL (ref 0.7–4.0)
MCH: 29.1 pg (ref 26.0–34.0)
MCHC: 32.6 g/dL (ref 30.0–36.0)
MCV: 89.4 fL (ref 78.0–100.0)
Monocytes Absolute: 0.5 10*3/uL (ref 0.1–1.0)
Monocytes Relative: 9 %
Neutro Abs: 3.3 10*3/uL (ref 1.7–7.7)
Neutrophils Relative %: 58 %
Platelets: 319 10*3/uL (ref 150–400)
RBC: 4.43 MIL/uL (ref 3.87–5.11)
RDW: 14 % (ref 11.5–15.5)
WBC: 5.7 10*3/uL (ref 4.0–10.5)

## 2017-01-31 LAB — COMPREHENSIVE METABOLIC PANEL
ALK PHOS: 59 U/L (ref 38–126)
ALT: 26 U/L (ref 14–54)
ANION GAP: 8 (ref 5–15)
AST: 24 U/L (ref 15–41)
Albumin: 3.5 g/dL (ref 3.5–5.0)
BILIRUBIN TOTAL: 0.7 mg/dL (ref 0.3–1.2)
BUN: <5 mg/dL — ABNORMAL LOW (ref 6–20)
CALCIUM: 8.7 mg/dL — AB (ref 8.9–10.3)
CO2: 24 mmol/L (ref 22–32)
Chloride: 105 mmol/L (ref 101–111)
Creatinine, Ser: 0.78 mg/dL (ref 0.44–1.00)
Glucose, Bld: 81 mg/dL (ref 65–99)
Potassium: 3.8 mmol/L (ref 3.5–5.1)
Sodium: 137 mmol/L (ref 135–145)
TOTAL PROTEIN: 6.3 g/dL — AB (ref 6.5–8.1)

## 2017-01-31 LAB — WET PREP, GENITAL
CLUE CELLS WET PREP: NONE SEEN
SPERM: NONE SEEN
Yeast Wet Prep HPF POC: NONE SEEN

## 2017-01-31 MED ORDER — KETOROLAC TROMETHAMINE 30 MG/ML IJ SOLN
15.0000 mg | Freq: Once | INTRAMUSCULAR | Status: AC
  Administered 2017-01-31: 15 mg via INTRAVENOUS
  Filled 2017-01-31: qty 1

## 2017-01-31 MED ORDER — AZITHROMYCIN 250 MG PO TABS
1000.0000 mg | ORAL_TABLET | Freq: Once | ORAL | Status: AC
  Administered 2017-01-31: 1000 mg via ORAL
  Filled 2017-01-31: qty 4

## 2017-01-31 MED ORDER — DOXYCYCLINE HYCLATE 100 MG PO CAPS: 100.0000 mg | ORAL_CAPSULE | Freq: Two times a day (BID) | ORAL | 0 refills | Status: AC

## 2017-01-31 MED ORDER — METRONIDAZOLE 500 MG PO TABS
2000.0000 mg | ORAL_TABLET | Freq: Once | ORAL | Status: AC
  Administered 2017-01-31: 2000 mg via ORAL
  Filled 2017-01-31: qty 4

## 2017-01-31 MED ORDER — DEXTROSE 5 % IV SOLN
1.0000 g | Freq: Once | INTRAVENOUS | Status: AC
  Administered 2017-01-31: 1 g via INTRAVENOUS
  Filled 2017-01-31: qty 10

## 2017-01-31 MED ORDER — NAPROXEN 500 MG PO TABS: 500.0000 mg | ORAL_TABLET | Freq: Two times a day (BID) | ORAL | 0 refills | Status: DC | PRN

## 2017-01-31 NOTE — ED Notes (Addendum)
Pt out of room when this RN came to draw blood and start IV. Pt assumed to be in US. No family in the room at this time.

## 2017-01-31 NOTE — ED Notes (Signed)
Patient able to ambulate independently  

## 2017-01-31 NOTE — Discharge Instructions (Signed)
Your vaginal swab reveals trichomonas, you were treated today for this, but you will need to have all partners tested and treated before engaging in sexual activity again. You have been treated for gonorrhea and chlamydia in the ER but the hospital will call you if lab is positive. You were tested for HIV and Syphilis, and the hospital will call you if the lab is positive. NO SEXUAL INTERCOURSE FOR AT LEAST 10 DAYS AFTER TODAY'S VISIT, THIS WILL INVALIDATE YOUR TREATMENT HERE. DO NOT ENGAGE IN SEXUAL ACTIVITY UNTIL YOU FIND OUT ABOUT YOUR RESULTS AND HAVE PARTNERS TESTED AND TREATED. ALL PARTNERS MUST BE TESTED AND TREATED FOR STD'S. ALWAYS USE CONDOMS WHEN ENGAGING IN INTERCOURSE. [facility] for [facility]D concerns or screenings.  Alternate between naprosyn and tylenol as directed as needed for pain. Given your pelvic pain on exam, and concern for possible infection, you are being treated for pelvic inflammatory disease as well; take antibiotic as directed until completed. Use heat to the area of pain.  For your diarrhea: Stay well hydrated with small sips of fluids throughout the day. Follow a BRAT (banana-rice-applesauce-toast) diet as described below for the next 24-48 hours. The 'BRAT' diet is suggested, then progress to diet as tolerated as symptoms abate. Call your regular doctor if bloody stools, persistent diarrhea, vomiting, fever or abdominal pain. The diarrhea may worsen slightly with the antibiotic, you may use over the counter imodium or pepto bismol to help with this if necessary, but it should improve after the antibiotic is completed.  Follow up with your regular doctor in 1 week for recheck of symptoms. [facility]) for changes or worsening symptoms.

## 2017-01-31 NOTE — ED Notes (Signed)
Patient transported to X-ray 

## 2017-01-31 NOTE — ED Triage Notes (Signed)
Pt c/o sharp pelvic pain since last night. No discharge noted, no problems urinating. LMP May

## 2017-01-31 NOTE — ED Provider Notes (Signed)
[facility]-EMERGENCY DEPT Provider Note   CSN: [id number] Arrival date & time: 01/31/17  1205     History   Chief Complaint Chief Complaint  Patient presents with  . Pelvic Pain    HPI Gloria Proctor is a 21 y.o. female with a PMHx of amenorrhea, HSV1, depression, and migraines, who presents to the ED with complaints of gradual onset "cervix" pain that began last night. She describes the pain is 7/10 constant sharp and stabbing "cervix" pain which is nonradiating worse with sitting and improved with laying down, with no treatments tried prior to arrival. She states it feels like something is stabbing her inside of her cervix. She also reports a proximally 7 episodes of watery nonbloody diarrhea this morning. She is sexually active with 6 female partners, 2 of which have been unprotected. LMP 01/16/17.  She denies fevers, chills, CP, SOB, abd pain, nausea/vomiting, constipation, obstipation, melena, hematochezia, hematuria, dysuria, vaginal bleeding/discharge, vaginal itching, genital sores, myalgias, arthralgias, numbness, tingling, focal weakness, or any other complaints at this time. Denies recent travel, sick contacts, suspicious food intake, EtOH use, NSAID use, or prior abd surgeries.    The history is provided by the patient and medical records. No language interpreter was used.  Pelvic Pain  This is a new problem. The current episode started yesterday. The problem occurs constantly. The problem has not changed since onset.Pertinent negatives include no chest pain, no abdominal pain and no shortness of breath. Exacerbated by: sitting. The symptoms are relieved by lying down. She has tried nothing for the symptoms. The treatment provided no relief.    Past Medical History:  Diagnosis Date  . Anxiety   . Depression   . Migraines     Patient Active Problem List   Diagnosis Date Noted  . Major depressive disorder, recurrent severe without psychotic features (HCC) 01/27/2016  . Cannabis  use disorder, moderate, dependence (HCC) 01/27/2016  . Tobacco use disorder 01/27/2016  . Amenorrhea 07/19/2015  . Hidradenitis suppurativa 12/21/2014  . Migraine without aura and without status migrainosus, not intractable 08/15/2013  . Obesity 07/02/2008  . RHINITIS, ALLERGIC 10/25/2006    Past Surgical History:  Procedure Laterality Date  . MULTIPLE EXTRACTIONS WITH ALVEOLOPLASTY N/A 06/04/2015   Procedure: REMOVAL OF WISDOM TEETH;  Surgeon: Ocie Doyne, DDS;  Location: [facility] OR;  Service: Oral Surgery;  Laterality: N/A;  . TONSILLECTOMY AND ADENOIDECTOMY N/A 08/16/2013   Procedure: TONSILLECTOMY AND ADENOIDECTOMY;  Surgeon: Flo Shanks, MD;  Location: [facility];  Service: ENT;  Laterality: N/A;    OB History    No data available       Home Medications    Prior to Admission medications   Medication Sig Start Date End Date Taking? Authorizing Provider  ALPRAZolam Prudy Feeler) 0.5 MG tablet Take 1 tablet (0.5 mg total) by mouth at bedtime as needed for anxiety. 11/07/16  Yes Cheron Schaumann K, PA-C  fluticasone (FLONASE) 50 MCG/ACT nasal spray PLACE 1 SPRAY INTO BOTH NOSTRILS DAILY. 12/21/16  Yes Rumley, Layton N, DO  hydrOXYzine (ATARAX/VISTARIL) 25 MG tablet Take 1 tablet (25 mg total) by mouth every 8 (eight) hours as needed for anxiety. 11/07/16  Yes Elson Areas, PA-C  valACYclovir (VALTREX) 500 MG tablet Take 1 tablet (500 mg total) by mouth 2 (two) times daily. Patient not taking: Reported on 01/31/2017 06/19/16   Elvina Sidle, MD    Family History Family History  Problem Relation Age of Onset  . Migraines Mother  Started having migraines during adulthood, after child birth  . Heart attack Paternal Grandfather     Social History Social History  Substance Use Topics  . Smoking status: Current Some Day Smoker    Types: Cigars    Start date: 03/28/2014  . Smokeless tobacco: Current User  . Alcohol use No     Allergies   Caffeine and Chocolate   Review of  Systems Review of Systems  Constitutional: Negative for chills and fever.  Respiratory: Negative for shortness of breath.   Cardiovascular: Negative for chest pain.  Gastrointestinal: Positive for diarrhea. Negative for abdominal pain, blood in stool, constipation, nausea and vomiting.  Genitourinary: Positive for pelvic pain and vaginal pain. Negative for dysuria, genital sores, hematuria, vaginal bleeding and vaginal discharge.  Musculoskeletal: Negative for arthralgias and myalgias.  Skin: Negative for color change.  Allergic/Immunologic: Negative for immunocompromised state.  Neurological: Negative for weakness and numbness.  Psychiatric/Behavioral: Negative for confusion.   All other systems reviewed and are negative for acute change except as noted in the HPI.    Physical Exam Updated Vital Signs BP 111/72   Pulse 71   Temp 98.8 F (37.1 C)   Resp 16   Ht 5\' 3"  (1.6 m)   Wt 121.6 kg (268 lb)   LMP 01/03/2017   SpO2 100%   BMI 47.47 kg/m   Physical Exam  Constitutional: She is oriented to person, place, and time. Vital signs are normal. She appears well-developed and well-nourished.  Non-toxic appearance. No distress.  Afebrile, nontoxic, NAD  HENT:  Head: Normocephalic and atraumatic.  Mouth/Throat: Oropharynx is clear and moist and mucous membranes are normal.  Eyes: Conjunctivae and EOM are normal. Right eye exhibits no discharge. Left eye exhibits no discharge.  Neck: Normal range of motion. Neck supple.  Cardiovascular: Normal rate, regular rhythm, normal heart sounds and intact distal pulses.  Exam reveals no gallop and no friction rub.   No murmur heard. Pulmonary/Chest: Effort normal and breath sounds normal. No respiratory distress. She has no decreased breath sounds. She has no wheezes. She has no rhonchi. She has no rales.  Abdominal: Soft. Normal appearance and bowel sounds are normal. She exhibits no distension. There is tenderness in the right lower  quadrant, suprapubic area and left lower quadrant. There is no rigidity, no rebound, no guarding, no CVA tenderness, no tenderness at McBurney's point and negative Murphy's sign.  Soft, obese but nondistended, +BS throughout, with mild diffuse lower abd TTP across pelvic brim, no focal area of specific tenderness, no r/g/r, neg murphy's, neg mcburney's point tenderness, neg psoas sign, neg foot tap test, no CVA TTP   Genitourinary: Pelvic exam was performed with patient supine. There is no rash, tenderness or lesion on the right labia. There is no rash, tenderness or lesion on the left labia. Uterus is tender. Uterus is not deviated, not enlarged and not fixed. Cervix exhibits motion tenderness. Cervix exhibits no discharge and no friability. Right adnexum displays tenderness. Right adnexum displays no mass and no fullness. Left adnexum displays tenderness. Left adnexum displays no mass and no fullness. No erythema, tenderness or bleeding in the vagina. Vaginal discharge found.  Genitourinary Comments: Chaperone present for exam. No rashes, lesions, or tenderness to external genitalia. No erythema, injury, or tenderness to vaginal mucosa. Thin greyish scant vaginal discharge without bleeding within vaginal vault. No adnexal masses or fullness, but diffuse b/l adnexal tenderness as well as tenderness during entire bimanual exam. +Mild CMT or just uterine tenderness,  no cervical friability or discharge from cervical os. Cervical os is closed. Uterus non-deviated, mobile, and without enlargement.    Musculoskeletal: Normal range of motion.  Neurological: She is alert and oriented to person, place, and time. She has normal strength. No sensory deficit.  Skin: Skin is warm, dry and intact. No rash noted.  Psychiatric: She has a normal mood and affect.  Nursing note and vitals reviewed.    ED Treatments / Results  Labs (all labs ordered are listed, but only abnormal results are displayed) Labs Reviewed    WET PREP, GENITAL - Abnormal; Notable for the following:       Result Value   Trich, Wet Prep PRESENT (*)    WBC, Wet Prep HPF POC MANY (*)    All other components within normal limits  COMPREHENSIVE METABOLIC PANEL - Abnormal; Notable for the following:    BUN <5 (*)    Calcium 8.7 (*)    Total Protein 6.3 (*)    All other components within normal limits  URINALYSIS, ROUTINE W REFLEX MICROSCOPIC - Abnormal; Notable for the following:    APPearance HAZY (*)    Leukocytes, UA SMALL (*)    Bacteria, UA RARE (*)    Squamous Epithelial / LPF 0-5 (*)    All other components within normal limits  CBC WITH DIFFERENTIAL/PLATELET  RPR  HIV ANTIBODY (ROUTINE TESTING)  POC URINE PREG, ED  GC/CHLAMYDIA PROBE AMP ([place]) NOT AT [facility]    EKG  EKG Interpretation None       Radiology [place] Transvaginal Non-ob  Result Date: 01/31/2017 CLINICAL DATA:  Pelvic pain EXAM: TRANSABDOMINAL AND TRANSVAGINAL ULTRASOUND OF PELVIS DOPPLER ULTRASOUND OF OVARIES TECHNIQUE: Both transabdominal and transvaginal ultrasound examinations of the pelvis were performed. Transabdominal technique was performed for global imaging of the pelvis including uterus, ovaries, adnexal regions, and pelvic cul-de-sac. It was necessary to proceed with endovaginal exam following the transabdominal exam to visualize the uterus and ovaries. Color and duplex Doppler ultrasound was utilized to evaluate blood flow to the ovaries. COMPARISON:  None. FINDINGS: Uterus Measurements: 8.2 x 2.9 x 3.2 cm. No fibroids or other mass visualized. Endometrium Thickness: 6.5 mm.  No focal abnormality visualized. Right ovary Measurements: 3.5 x 2.4 x 2.5 cm. Normal appearance/no adnexal mass. Left ovary Measurements: 3.5 x 2.0 x 2.1 cm. Normal appearance/no adnexal mass. Pulsed Doppler evaluation of both ovaries demonstrates normal low-resistance arterial and venous waveforms. Other findings Moderate volume free fluid in the pelvis. IMPRESSION: 1. No  ovarian torsion during the exam. 2. Moderate volume free fluid in the pelvis, but otherwise normal pelvic ultrasound. Electronically Signed   By: Deatra RobinsonKevin  Herman M.D.   On: 01/31/2017 14:10   [place] Pelvis Complete  Result Date: 01/31/2017 CLINICAL DATA:  Pelvic pain EXAM: TRANSABDOMINAL AND TRANSVAGINAL ULTRASOUND OF PELVIS DOPPLER ULTRASOUND OF OVARIES TECHNIQUE: Both transabdominal and transvaginal ultrasound examinations of the pelvis were performed. Transabdominal technique was performed for global imaging of the pelvis including uterus, ovaries, adnexal regions, and pelvic cul-de-sac. It was necessary to proceed with endovaginal exam following the transabdominal exam to visualize the uterus and ovaries. Color and duplex Doppler ultrasound was utilized to evaluate blood flow to the ovaries. COMPARISON:  None. FINDINGS: Uterus Measurements: 8.2 x 2.9 x 3.2 cm. No fibroids or other mass visualized. Endometrium Thickness: 6.5 mm.  No focal abnormality visualized. Right ovary Measurements: 3.5 x 2.4 x 2.5 cm. Normal appearance/no adnexal mass. Left ovary Measurements: 3.5 x 2.0 x 2.1 cm. Normal  appearance/no adnexal mass. Pulsed Doppler evaluation of both ovaries demonstrates normal low-resistance arterial and venous waveforms. Other findings Moderate volume free fluid in the pelvis. IMPRESSION: 1. No ovarian torsion during the exam. 2. Moderate volume free fluid in the pelvis, but otherwise normal pelvic ultrasound. Electronically Signed   By: Deatra Robinson M.D.   On: 01/31/2017 14:10   [place] Art/ven Flow Abd Pelv Doppler  Result Date: 01/31/2017 CLINICAL DATA:  Pelvic pain EXAM: TRANSABDOMINAL AND TRANSVAGINAL ULTRASOUND OF PELVIS DOPPLER ULTRASOUND OF OVARIES TECHNIQUE: Both transabdominal and transvaginal ultrasound examinations of the pelvis were performed. Transabdominal technique was performed for global imaging of the pelvis including uterus, ovaries, adnexal regions, and pelvic cul-de-sac. It was necessary to  proceed with endovaginal exam following the transabdominal exam to visualize the uterus and ovaries. Color and duplex Doppler ultrasound was utilized to evaluate blood flow to the ovaries. COMPARISON:  None. FINDINGS: Uterus Measurements: 8.2 x 2.9 x 3.2 cm. No fibroids or other mass visualized. Endometrium Thickness: 6.5 mm.  No focal abnormality visualized. Right ovary Measurements: 3.5 x 2.4 x 2.5 cm. Normal appearance/no adnexal mass. Left ovary Measurements: 3.5 x 2.0 x 2.1 cm. Normal appearance/no adnexal mass. Pulsed Doppler evaluation of both ovaries demonstrates normal low-resistance arterial and venous waveforms. Other findings Moderate volume free fluid in the pelvis. IMPRESSION: 1. No ovarian torsion during the exam. 2. Moderate volume free fluid in the pelvis, but otherwise normal pelvic ultrasound. Electronically Signed   By: Deatra Robinson M.D.   On: 01/31/2017 14:10    Procedures Procedures (including critical care time)  Medications Ordered in ED Medications  ketorolac (TORADOL) 30 MG/ML injection 15 mg (15 mg Intravenous Given 01/31/17 1400)  azithromycin (ZITHROMAX) tablet 1,000 mg (1,000 mg Oral Given 01/31/17 1400)  cefTRIAXone (ROCEPHIN) 1 g in dextrose 5 % 50 mL IVPB (0 g Intravenous Stopped 01/31/17 1503)  metroNIDAZOLE (FLAGYL) tablet 2,000 mg (2,000 mg Oral Given 01/31/17 1400)     Initial Impression / Assessment and Plan / ED Course  I have reviewed the triage vital signs and the nursing notes.  Pertinent labs & imaging results that were available during my care of the patient were reviewed by me and considered in my medical decision making (see chart for details).     21 y.o. female here with sharp pelvic pain that began yesterday, states it feels like something is stabbing her in her cervix. She also reports diarrhea this morning. On exam, diffuse lower abdominal TTP, non-peritoneal, no focal McBurney's point tenderness. Pelvic exam reveals scant thin grayish discharge,  diffuse tenderness and mild CMT/uterine tenderness. Will get labs, pelvic ultrasound, empirically treat for GC/CT, give pain meds, and reassess shortly.  3:34 PM CBC w/diff unremarkable. CMP WNL. U/A with some squamous but rare bacteria, no significant evidence of UTI, likely contaminated from vaginal contaminants. Upreg neg. Wet prep reveals trichomonas, treated for this as well as empiric tx for GC/CT. U/S unremarkable aside from some moderate free pelvic fluid likely physiologic. Will treat for PID given her CMT on exam. Advised BRAT diet for diarrhea, unfortunately the diarrhea may get slightly worse with the abx, but advised OTC remedies for this. Rx naprosyn given, advised tylenol use as well as heat pad. Discussed abstinence x10 days. F/up with health dept for future STD concerns. Safe sex encouraged, and discussed having partners tested and treated before re-engaging in intercourse after the 10 day abstinence period and after today's trichomonas tx. F/up with PCP in 1wk for recheck. I explained the  diagnosis and have given explicit precautions to return to the ER including for any other new or worsening symptoms. The patient understands and accepts the medical plan as it's been dictated and I have answered their questions. Discharge instructions concerning home care and prescriptions have been given. The patient is STABLE and is discharged to home in good condition.    Final Clinical Impressions(s) / ED Diagnoses   Final diagnoses:  Pelvic pain  Pelvic pain in female  Trichomonas infection  PID (pelvic inflammatory disease)  Diarrhea, unspecified type    New Prescriptions New Prescriptions   DOXYCYCLINE (VIBRAMYCIN) 100 MG CAPSULE    Take 1 capsule (100 mg total) by mouth 2 (two) times daily.   NAPROXEN (NAPROSYN) 500 MG TABLET    Take 1 tablet (500 mg total) by mouth 2 (two) times daily as needed for mild pain, moderate pain or headache (TAKE WITH MEALS.).     [address] [place],  [place] 01/31/17 1544    Eudelia Bunch Amadeo Garnet, MD 02/03/17 [phone]

## 2017-02-01 LAB — HIV ANTIBODY (ROUTINE TESTING W REFLEX): HIV Screen 4th Generation wRfx: NONREACTIVE

## 2017-02-01 LAB — GC/CHLAMYDIA PROBE AMP (~~LOC~~) NOT AT ARMC
CHLAMYDIA, DNA PROBE: POSITIVE — AB
Neisseria Gonorrhea: POSITIVE — AB

## 2017-02-01 LAB — RPR: RPR Ser Ql: NONREACTIVE

## 2017-11-14 ENCOUNTER — Encounter (HOSPITAL_COMMUNITY): Payer: Self-pay

## 2017-11-14 ENCOUNTER — Emergency Department (HOSPITAL_COMMUNITY)
Admission: EM | Admit: 2017-11-14 | Discharge: 2017-11-14 | Disposition: A | Discharge status: Home / Self Care | Payer: Self-pay | Attending: Emergency Medicine | Admitting: Emergency Medicine

## 2017-11-14 ENCOUNTER — Other Ambulatory Visit: Payer: Self-pay

## 2017-11-14 DIAGNOSIS — N6310 Unspecified lump in the right breast, unspecified quadrant: Secondary | ICD-10-CM | POA: Insufficient documentation

## 2017-11-14 DIAGNOSIS — N63 Unspecified lump in unspecified breast: Secondary | ICD-10-CM

## 2017-11-14 DIAGNOSIS — F17290 Nicotine dependence, other tobacco product, uncomplicated: Secondary | ICD-10-CM | POA: Insufficient documentation

## 2017-11-14 DIAGNOSIS — Z79899 Other long term (current) drug therapy: Secondary | ICD-10-CM | POA: Insufficient documentation

## 2017-11-14 LAB — CBC WITH DIFFERENTIAL/PLATELET
Basophils Absolute: 0 10*3/uL (ref 0.0–0.1)
Basophils Relative: 0 %
EOS ABS: 0.1 10*3/uL (ref 0.0–0.7)
Eosinophils Relative: 1 %
HEMATOCRIT: 40.5 % (ref 36.0–46.0)
HEMOGLOBIN: 13.4 g/dL (ref 12.0–15.0)
Lymphocytes Relative: 30 %
Lymphs Abs: 1.9 10*3/uL (ref 0.7–4.0)
MCH: 30 pg (ref 26.0–34.0)
MCHC: 33.1 g/dL (ref 30.0–36.0)
MCV: 90.6 fL (ref 78.0–100.0)
MONOS PCT: 8 %
Monocytes Absolute: 0.5 10*3/uL (ref 0.1–1.0)
NEUTROS ABS: 3.7 10*3/uL (ref 1.7–7.7)
NEUTROS PCT: 61 %
Platelets: 305 10*3/uL (ref 150–400)
RBC: 4.47 MIL/uL (ref 3.87–5.11)
RDW: 14.1 % (ref 11.5–15.5)
WBC: 6.2 10*3/uL (ref 4.0–10.5)

## 2017-11-14 LAB — BASIC METABOLIC PANEL
Anion gap: 10 (ref 5–15)
BUN: 7 mg/dL (ref 6–20)
CHLORIDE: 103 mmol/L (ref 101–111)
CO2: 23 mmol/L (ref 22–32)
CREATININE: 0.78 mg/dL (ref 0.44–1.00)
Calcium: 8.9 mg/dL (ref 8.9–10.3)
GFR calc Af Amer: >60 mL/min (ref >60)
GFR calc non Af Amer: >60 mL/min (ref >60)
Glucose, Bld: 89 mg/dL (ref 65–99)
POTASSIUM: 3.7 mmol/L (ref 3.5–5.1)
Sodium: 136 mmol/L (ref 135–145)

## 2017-11-14 MED ORDER — SULFAMETHOXAZOLE-TRIMETHOPRIM 800-160 MG PO TABS: 1.0000 | ORAL_TABLET | Freq: Two times a day (BID) | ORAL | 0 refills | Status: AC

## 2017-11-14 MED ORDER — TRAMADOL HCL 50 MG PO TABS: 50.0000 mg | ORAL_TABLET | Freq: Four times a day (QID) | ORAL | 0 refills | Status: DC | PRN

## 2017-11-14 NOTE — ED Provider Notes (Signed)
[facility] Provider Note   CSN: [id number] Arrival date & time: 11/14/17  1350     History   Chief Complaint Chief Complaint  Patient presents with  . Lump on breast    HPI Gloria Proctor is a 22 y.o. female.  HPI   22 year old female presents today with complaints of right-sided breast pain.  Patient notes approximately 1 week ago she felt a nodule in her breast that felt as if she was going to get a boil.  She has had these before in the past but usually come to surface it pop.  She notes that this did not, and developed another area adjacent to it.  She denies any discharge fever chills nausea or vomiting.  Patient has not taken any medications for this.  She reports she does not have a primary care provider.  Past Medical History:  Diagnosis Date  . Anxiety   . Depression   . Migraines     Patient Active Problem List   Diagnosis Date Noted  . Major depressive disorder, recurrent severe without psychotic features (HCC) 01/27/2016  . Cannabis use disorder, moderate, dependence (HCC) 01/27/2016  . Tobacco use disorder 01/27/2016  . Amenorrhea 07/19/2015  . Hidradenitis suppurativa 12/21/2014  . Migraine without aura and without status migrainosus, not intractable 08/15/2013  . Obesity 07/02/2008  . RHINITIS, ALLERGIC 10/25/2006    Past Surgical History:  Procedure Laterality Date  . MULTIPLE EXTRACTIONS WITH ALVEOLOPLASTY N/A 06/04/2015   Procedure: REMOVAL OF WISDOM TEETH;  Surgeon: Ocie DoyneScott Jensen, DDS;  Location: [facility] OR;  Service: Oral Surgery;  Laterality: N/A;  . TONSILLECTOMY AND ADENOIDECTOMY N/A 08/16/2013   Procedure: TONSILLECTOMY AND ADENOIDECTOMY;  Surgeon: Flo ShanksKarol Wolicki, MD;  Location: [facility] OR[facility];  Service: ENT;  Laterality: N/A;    OB History    No data available       Home Medications    Prior to Admission medications   Medication Sig Start Date End Date Taking? Authorizing Provider  ALPRAZolam Prudy Feeler(XANAX) 0.5  MG tablet Take 1 tablet (0.5 mg total) by mouth at bedtime as needed for anxiety. 11/07/16   Elson AreasSofia, Leslie K, PA-C  fluticasone (FLONASE) 50 MCG/ACT nasal spray PLACE 1 SPRAY INTO BOTH NOSTRILS DAILY. 12/21/16   Rumley, Lora Havensaleigh N, DO  hydrOXYzine (ATARAX/VISTARIL) 25 MG tablet Take 1 tablet (25 mg total) by mouth every 8 (eight) hours as needed for anxiety. 11/07/16   Elson AreasSofia, Leslie K, PA-C  naproxen (NAPROSYN) 500 MG tablet Take 1 tablet (500 mg total) by mouth 2 (two) times daily as needed for mild pain, moderate pain or headache (TAKE WITH MEALS.). 01/31/17   Street, Mercedes, PA-C  sulfamethoxazole-trimethoprim (BACTRIM DS,SEPTRA DS) 800-160 MG tablet Take 1 tablet by mouth 2 (two) times daily for 7 days. 11/14/17 11/21/17  Bassy Fetterly, Tinnie GensJeffrey, PA-C  traMADol (ULTRAM) 50 MG tablet Take 1 tablet (50 mg total) by mouth every 6 (six) hours as needed. 11/14/17   Jaymie Misch, Tinnie GensJeffrey, PA-C  valACYclovir (VALTREX) 500 MG tablet Take 1 tablet (500 mg total) by mouth 2 (two) times daily. Patient not taking: Reported on 01/31/2017 06/19/16   Elvina SidleLauenstein, Kurt, MD    Family History Family History  Problem Relation Age of Onset  . Migraines Mother        Started having migraines during adulthood, after child birth  . Heart attack Paternal Grandfather     Social History Social History   Tobacco Use  . Smoking status: Current Some Day Smoker  Types: Cigars    Start date: 03/28/2014  . Smokeless tobacco: Current User  Substance Use Topics  . Alcohol use: No    Alcohol/week: 0.0 oz  . Drug use: No    Allergies   Caffeine and Chocolate   Review of Systems Review of Systems  All other systems reviewed and are negative.   Physical Exam Updated Vital Signs BP 137/71   Pulse 65   Temp 98.6 F (37 C) (Oral)   Resp 16   Wt 121.6 kg (268 lb)   SpO2 100%   BMI 47.47 kg/m   Physical Exam  Constitutional: She is oriented to person, place, and time. She appears well-developed and well-nourished.  HENT:    Head: Normocephalic and atraumatic.  Eyes: Conjunctivae are normal. Pupils are equal, round, and reactive to light. Right eye exhibits no discharge. Left eye exhibits no discharge. No scleral icterus.  Neck: Normal range of motion. No JVD present. No tracheal deviation present.  Pulmonary/Chest: Effort normal. No stridor.  Right breast with 2 separate areas of induration at the 3 o'clock position-no fluctuance or discharge  Neurological: She is alert and oriented to person, place, and time. Coordination normal.  Psychiatric: She has a normal mood and affect. Her behavior is normal. Judgment and thought content normal.  Nursing note and vitals reviewed.   ED Treatments / Results  Labs (all labs ordered are listed, but only abnormal results are displayed) Labs Reviewed  CBC WITH DIFFERENTIAL/PLATELET  BASIC METABOLIC PANEL    EKG  EKG Interpretation None       Radiology No results found.  Procedures Procedures (including critical care time)  Medications Ordered in ED Medications - No data to display   Initial Impression / Assessment and Plan / ED Course  I have reviewed the triage vital signs and the nursing notes.  Pertinent labs & imaging results that were available during my care of the patient were reviewed by me and considered in my medical decision making (see chart for details).     Final Clinical Impressions(s) / ED Diagnoses   Final diagnoses:  Breast mass    Labs:   Imaging:  Consults:  Therapeutics:  Discharge Meds: Bactrim, Ultram  Assessment/Plan: 22 year old female presents today with likely breast abscesses.  Patient with no systemic symptoms these are localized at this time.  Patient will be placed on antibiotics, case management was consulted who helped arrange outpatient primary care follow-up.  Patient will be referred to the breast center for imaging and disposition with likely general surgery follow-up.  Also in differential includes  breast cancer which will be further characterized on mammogram and ultrasound.  Patient is given strict return precautions, she verbalized understanding and agreement to today's plan had no further questions or concerns at time of discharge     ED Discharge Orders        Ordered    sulfamethoxazole-trimethoprim (BACTRIM DS,SEPTRA DS) 800-160 MG tablet  2 times daily     11/14/17 1615    traMADol (ULTRAM) 50 MG tablet  Every 6 hours PRN     11/14/17 1620       Eyvonne Mechanic, PA-C 11/14/17 1622    Mancel Bale, MD 11/15/17 [phone]

## 2017-11-14 NOTE — ED Notes (Signed)
Declined W/C at D/C and was escorted to lobby by RN. 

## 2017-11-14 NOTE — Discharge Instructions (Addendum)
Please read attached information. If you experience any new or worsening signs or symptoms please return to the emergency room for evaluation.  Please follow-up with the breast clinic for imaging, follow-up with primary care for ongoing management return immediately to the emergency room if you develop any new or worsening signs or symptoms.  Please take medication as directed.

## 2017-11-14 NOTE — ED Triage Notes (Signed)
Pt presents to the ed with complaints of a lump on her breast x 1 week. Pt states it started out as a pimple looking bump and over the last week has gotten worse, the area is hard on the outer side and soft in the middle.

## 2017-11-14 NOTE — ED Provider Notes (Signed)
Patient placed in Quick Look pathway, seen and evaluated   Chief Complaint: breast lump  HPI:   Right breast lump that has been growing x 1 week, pt states it first began as a boil but has been enlarging but getting flatter as well. Associated with mild pain to some area of it and mild warmth. Had chills x 2 days. H/o boils to this area in the past.  ROS: No fevers, body aches, sweats.   Physical Exam:   Gen: No distress  Neuro: Awake and Alert  Skin: Warm    Focused Exam: 3 x 3 cm of induration with fluctuance to right medial breast, mild warmth and tenderness. No nipple discharge. Afebrile, no tachycardia.   Initiation of care has begun. The patient has been counseled on the process, plan, and necessity for staying for the completion/evaluation, and the remainder of the medical screening examination    Jerrell MylarGibbons, Emma-Lee Oddo J, PA-C 11/14/17 1452    Azalia Bilisampos, Kevin, MD 11/14/17 [phone]

## 2017-11-23 ENCOUNTER — Ambulatory Visit (INDEPENDENT_AMBULATORY_CARE_PROVIDER_SITE_OTHER): Payer: Self-pay | Admitting: Student in an Organized Health Care Education/Training Program

## 2017-11-23 ENCOUNTER — Other Ambulatory Visit: Payer: Self-pay

## 2017-11-23 ENCOUNTER — Encounter: Payer: Self-pay | Admitting: Student in an Organized Health Care Education/Training Program

## 2017-11-23 DIAGNOSIS — N611 Abscess of the breast and nipple: Secondary | ICD-10-CM

## 2017-11-23 MED ORDER — SULFAMETHOXAZOLE-TRIMETHOPRIM 800-160 MG PO TABS: 1.0000 | ORAL_TABLET | Freq: Two times a day (BID) | ORAL | 0 refills | Status: DC

## 2017-11-23 NOTE — Patient Instructions (Signed)
It was a pleasure seeing you today in our clinic. Here is the treatment plan we have discussed and agreed upon together:  Antibiotics were sent to your pharmacy. Keep the site of the draining abscess dry and dressed as we discussed.  Schedule follow up to be seen again in 1 week  Call us sooner if you develop redness or warmth around the abscess, or if you develop fevers and start to feel sick.  Sign up for My Chart to have easy access to your labs results, and communication with your primary care physician.   Our clinic's number is [phone]. Please call with questions or concerns about what we discussed today.  Be well, Dr. Mosetta PuttFeng

## 2017-11-23 NOTE — Progress Notes (Signed)
Subjective:    Gloria Proctor - 22 y.o. female MRN [id number]  Date of birth: 18-May-1996  HPI  Gloria Proctor is here for breast boils.   Breast boils/abscess Patient was seen in ED on 3/20 for one week of right-sided breast pain, which seemed consistent with previous symptoms of breast boil. At that time she was noted to have 2 separate areas of induration on the right breast at 3 o'clock position without fluctuance or discharge. She was given a course of bactrim and arranged for imaging and general surgery follow up.   She reports she has taken antiobiotics since then. She has 2 more days of bactrim. Large boil burst with bloody pus on Saturday and left a hole in breast. The other, smaller boil is improving (not getting larger) with antibiotics but has not been draining. - denies surrounding erythema - no chills - no fever - no nausea/vomiting - medications tried: none other than bactrim  Health Maintenance:   Health Maintenance Due  Topic Date Due  . PAP SMEAR  03/10/2017  . INFLUENZA VACCINE  03/28/2017    -  reports that she has been smoking cigars.  She started smoking about 3 years ago. She uses smokeless tobacco. - Review of Systems: Per HPI. - Past Medical History: Patient Active Problem List   Diagnosis Date Noted  . Breast abscess 11/23/2017  . Major depressive disorder, recurrent severe without psychotic features (HCC) 01/27/2016  . Cannabis use disorder, moderate, dependence (HCC) 01/27/2016  . Tobacco use disorder 01/27/2016  . Amenorrhea 07/19/2015  . Hidradenitis suppurativa 12/21/2014  . Migraine without aura and without status migrainosus, not intractable 08/15/2013  . Obesity 07/02/2008  . RHINITIS, ALLERGIC 10/25/2006   - Medications: reviewed and updated Current Outpatient Medications  Medication Sig Dispense Refill  . ALPRAZolam (XANAX) 0.5 MG tablet Take 1 tablet (0.5 mg total) by mouth at bedtime as needed for anxiety. 6 tablet 0  .  fluticasone (FLONASE) 50 MCG/ACT nasal spray PLACE 1 SPRAY INTO BOTH NOSTRILS DAILY. 16 g 1  . hydrOXYzine (ATARAX/VISTARIL) 25 MG tablet Take 1 tablet (25 mg total) by mouth every 8 (eight) hours as needed for anxiety. 12 tablet 0  . naproxen (NAPROSYN) 500 MG tablet Take 1 tablet (500 mg total) by mouth 2 (two) times daily as needed for mild pain, moderate pain or headache (TAKE WITH MEALS.). 20 tablet 0  . [START ON 11/25/2017] sulfamethoxazole-trimethoprim (BACTRIM DS,SEPTRA DS) 800-160 MG tablet Take 1 tablet by mouth 2 (two) times daily. 14 tablet 0  . traMADol (ULTRAM) 50 MG tablet Take 1 tablet (50 mg total) by mouth every 6 (six) hours as needed. 15 tablet 0  . valACYclovir (VALTREX) 500 MG tablet Take 1 tablet (500 mg total) by mouth 2 (two) times daily. (Patient not taking: Reported on 01/31/2017) 6 tablet 1   No current facility-administered medications for this visit.     Review of Systems See HPI     Objective:   Physical Exam BP 100/80   Pulse 78   Temp 98.4 F (36.9 C) (Oral)   Wt 240 lb (108.9 kg)   LMP 11/16/2017 (Exact Date)   SpO2 98%   BMI 42.51 kg/m  Gen: NAD, alert, cooperative with exam, well-appearing  SKIN: ~3cm x 3cm coin-like area of open skin where previous boil drained, no surrounding erythema or warmth, at 3 o'clock position under breast. Second area of induration around 3 o clock position is ~2c mlong, 1/2 cm in diameter  with scar-like tissue extending. No drainage, warmth or erythema.     Assessment & Plan:   Breast abscess Right breast. First lesion spontaneously drained and appears clean and dry, though open. Dry dressing placed over the lesion today.  Second lesion is too deep for drainage in-office, but has been improving on antibiotics. Suspect it may continue to improve with antibiotics.  - Continue bactrim (refilled, goodrx coupon provided - follow up in one  Week - return sooner if systemic symptoms or develops evidence of surrounding  infection - continue dry dressings - next steps: if worsening second lesion can consider ultrasound, surgery referral - sulfamethoxazole-trimethoprim (BACTRIM DS,SEPTRA DS) 800-160 MG tablet; Take 1 tablet by mouth 2 (two) times daily.  Dispense: 14 tablet; Refill: 0   No insurance. 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  Meds ordered this encounter  Medications  . sulfamethoxazole-trimethoprim (BACTRIM DS,SEPTRA DS) 800-160 MG tablet    Sig: Take 1 tablet by mouth 2 (two) times daily.    Dispense:  14 tablet    Refill:  0    Howard PouchLauren Tige Meas, MD,MS,  PGY2 11/23/2017 10:21 AM

## 2017-11-23 NOTE — Assessment & Plan Note (Addendum)
Right breast. First lesion spontaneously drained and appears clean and dry, though open. Dry dressing placed over the lesion today.  Second lesion is too deep for drainage in-office, but has been improving on antibiotics. Suspect it may continue to improve with antibiotics.  - Continue bactrim (refilled, goodrx coupon provided - follow up in one  Week - return sooner if systemic symptoms or develops evidence of surrounding infection - continue dry dressings - next steps: if worsening second lesion can consider ultrasound, surgery referral - sulfamethoxazole-trimethoprim (BACTRIM DS,SEPTRA DS) 800-160 MG tablet; Take 1 tablet by mouth 2 (two) times daily.  Dispense: 14 tablet; Refill: 0

## 2017-11-30 ENCOUNTER — Ambulatory Visit: Payer: Self-pay | Admitting: Family Medicine

## 2019-04-05 ENCOUNTER — Ambulatory Visit (HOSPITAL_COMMUNITY)
Admission: EM | Admit: 2019-04-05 | Discharge: 2019-04-05 | Disposition: A | Discharge status: Home / Self Care | Payer: HRSA Program | Attending: Family Medicine | Admitting: Family Medicine

## 2019-04-05 ENCOUNTER — Encounter (HOSPITAL_COMMUNITY): Payer: Self-pay

## 2019-04-05 ENCOUNTER — Other Ambulatory Visit: Payer: Self-pay

## 2019-04-05 DIAGNOSIS — Z91018 Allergy to other foods: Secondary | ICD-10-CM | POA: Insufficient documentation

## 2019-04-05 DIAGNOSIS — F1220 Cannabis dependence, uncomplicated: Secondary | ICD-10-CM | POA: Insufficient documentation

## 2019-04-05 DIAGNOSIS — Z20828 Contact with and (suspected) exposure to other viral communicable diseases: Secondary | ICD-10-CM | POA: Diagnosis not present

## 2019-04-05 DIAGNOSIS — F332 Major depressive disorder, recurrent severe without psychotic features: Secondary | ICD-10-CM | POA: Insufficient documentation

## 2019-04-05 DIAGNOSIS — J029 Acute pharyngitis, unspecified: Secondary | ICD-10-CM | POA: Insufficient documentation

## 2019-04-05 DIAGNOSIS — F17290 Nicotine dependence, other tobacco product, uncomplicated: Secondary | ICD-10-CM | POA: Insufficient documentation

## 2019-04-05 DIAGNOSIS — E669 Obesity, unspecified: Secondary | ICD-10-CM | POA: Insufficient documentation

## 2019-04-05 DIAGNOSIS — F419 Anxiety disorder, unspecified: Secondary | ICD-10-CM | POA: Insufficient documentation

## 2019-04-05 DIAGNOSIS — Z79899 Other long term (current) drug therapy: Secondary | ICD-10-CM | POA: Insufficient documentation

## 2019-04-05 LAB — POCT RAPID STREP A: Streptococcus, Group A Screen (Direct): NEGATIVE

## 2019-04-05 MED ORDER — LIDOCAINE VISCOUS HCL 2 % MT SOLN
15.0000 mL | Freq: Once | OROMUCOSAL | Status: AC
  Administered 2019-04-05: 15 mL via ORAL

## 2019-04-05 MED ORDER — LIDOCAINE VISCOUS HCL 2 % MT SOLN
OROMUCOSAL | Status: AC
  Filled 2019-04-05: qty 15

## 2019-04-05 MED ORDER — ALUM & MAG HYDROXIDE-SIMETH 200-200-20 MG/5ML PO SUSP
ORAL | Status: AC
  Filled 2019-04-05: qty 30

## 2019-04-05 MED ORDER — IBUPROFEN 800 MG PO TABS
ORAL_TABLET | ORAL | Status: AC
  Filled 2019-04-05: qty 1

## 2019-04-05 MED ORDER — ALUM & MAG HYDROXIDE-SIMETH 200-200-20 MG/5ML PO SUSP
30.0000 mL | Freq: Once | ORAL | Status: AC
  Administered 2019-04-05: 30 mL via ORAL

## 2019-04-05 MED ORDER — LIDOCAINE-EPINEPHRINE (PF) 2 %-1:200000 IJ SOLN
INTRAMUSCULAR | Status: AC
  Filled 2019-04-05: qty 20

## 2019-04-05 NOTE — ED Triage Notes (Signed)
Pt present sore throat, symptoms started Wednesday. Pt states it hurts when she swallows.

## 2019-04-05 NOTE — ED Provider Notes (Signed)
[facility]    CSN: [id number] Arrival date & time: 04/05/19  1212     History   Chief Complaint Chief Complaint  Patient presents with  . Sore Throat    HPI Gloria Proctor is a 23 y.o. female.   Gloria Proctor presents with complaints of sore throat. History  Of tonsillectomy s/p tonsil abscess. States the pain does feel similar to episodes she had prior to tonsillectomy. Soreness. Swallowing is uncomfortable. Today is worse. Some ache to neck. No fevers. No cough or congestion. No ear pain. Increased gas, no diarrhea or constipation. No vomiting. No medications for symptoms. Hot tea helps some. Works at [employer] in [employer]. No specific ill contacts. History of anxiety, depression, migraines.    ROS per HPI, negative if not otherwise mentioned.      Past Medical History:  Diagnosis Date  . Anxiety   . Depression   . Migraines     Patient Active Problem List   Diagnosis Date Noted  . Breast abscess 11/23/2017  . Major depressive disorder, recurrent severe without psychotic features (HCC) 01/27/2016  . Cannabis use disorder, moderate, dependence (HCC) 01/27/2016  . Tobacco use disorder 01/27/2016  . Amenorrhea 07/19/2015  . Hidradenitis suppurativa 12/21/2014  . Migraine without aura and without status migrainosus, not intractable 08/15/2013  . Obesity 07/02/2008  . RHINITIS, ALLERGIC 10/25/2006    Past Surgical History:  Procedure Laterality Date  . MULTIPLE EXTRACTIONS WITH ALVEOLOPLASTY N/A 06/04/2015   Procedure: REMOVAL OF WISDOM TEETH;  Surgeon: Ocie DoyneScott Jensen, DDS;  Location: [facility];  Service: Oral Surgery;  Laterality: N/A;  . TONSILLECTOMY AND ADENOIDECTOMY N/A 08/16/2013   Procedure: TONSILLECTOMY AND ADENOIDECTOMY;  Surgeon: Flo ShanksKarol Wolicki, MD;  Location: [facility];  Service: ENT;  Laterality: N/A;    OB History   No obstetric history on file.      Home Medications    Prior to Admission medications   Medication Sig Start Date End  Date Taking? Authorizing Provider  ALPRAZolam Prudy Feeler(XANAX) 0.5 MG tablet Take 1 tablet (0.5 mg total) by mouth at bedtime as needed for anxiety. 11/07/16   Elson AreasSofia, Leslie K, PA-C  fluticasone (FLONASE) 50 MCG/ACT nasal spray PLACE 1 SPRAY INTO BOTH NOSTRILS DAILY. 12/21/16   Rumley, Lora Havensaleigh N, DO  hydrOXYzine (ATARAX/VISTARIL) 25 MG tablet Take 1 tablet (25 mg total) by mouth every 8 (eight) hours as needed for anxiety. 11/07/16   Elson AreasSofia, Leslie K, PA-C  naproxen (NAPROSYN) 500 MG tablet Take 1 tablet (500 mg total) by mouth 2 (two) times daily as needed for mild pain, moderate pain or headache (TAKE WITH MEALS.). 01/31/17   Street, Mercedes, PA-C  sulfamethoxazole-trimethoprim (BACTRIM DS,SEPTRA DS) 800-160 MG tablet Take 1 tablet by mouth 2 (two) times daily. 11/25/17   Howard PouchFeng, Lauren, MD  traMADol (ULTRAM) 50 MG tablet Take 1 tablet (50 mg total) by mouth every 6 (six) hours as needed. 11/14/17   Hedges, Tinnie GensJeffrey, PA-C  valACYclovir (VALTREX) 500 MG tablet Take 1 tablet (500 mg total) by mouth 2 (two) times daily. Patient not taking: Reported on 01/31/2017 06/19/16   Elvina SidleLauenstein, Kurt, MD    Family History Family History  Problem Relation Age of Onset  . Migraines Mother        Started having migraines during adulthood, after child birth  . Heart attack Paternal Grandfather     Social History Social History   Tobacco Use  . Smoking status: Current Some Day Smoker    Types: Cigars  Start date: 03/28/2014  . Smokeless tobacco: Current User  Substance Use Topics  . Alcohol use: No    Alcohol/week: 0.0 standard drinks  . Drug use: No     Allergies   Caffeine and Chocolate   Review of Systems Review of Systems   Physical Exam Triage Vital Signs ED Triage Vitals  Enc Vitals Group     BP 04/05/19 1255 (!) 129/98     Pulse Rate 04/05/19 1255 69     Resp 04/05/19 1255 18     Temp 04/05/19 1255 99.5 F (37.5 C)     Temp Source 04/05/19 1255 Oral     SpO2 04/05/19 1255 100 %     Weight --       Height --      Head Circumference --      Peak Flow --      Pain Score 04/05/19 1258 5     Pain Loc --      Pain Edu? --      Excl. in [place]? --    No data found.  Updated Vital Signs BP (!) 129/98 (BP Location: Right Arm)   Pulse 69   Temp 99.5 F (37.5 C) (Oral)   Resp 18   LMP 03/13/2019   SpO2 100%   Visual Acuity Right Eye Distance:   Left Eye Distance:   Bilateral Distance:    Right Eye Near:   Left Eye Near:    Bilateral Near:     Physical Exam Constitutional:      General: She is not in acute distress.    Appearance: She is well-developed.  HENT:     Mouth/Throat:     Pharynx: Posterior oropharyngeal erythema present.     Tonsils: No tonsillar exudate. 0 on the right. 0 on the left.     Comments: Noted ulcers / white lesions to very posterior tongue; no swelling; swallowing and speaking without difficulty  Cardiovascular:     Rate and Rhythm: Normal rate.     Heart sounds: Normal heart sounds.  Pulmonary:     Effort: Pulmonary effort is normal.  Skin:    General: Skin is warm and dry.  Neurological:     Mental Status: She is alert and oriented to person, place, and time.      UC Treatments / Results  Labs (all labs ordered are listed, but only abnormal results are displayed) Labs Reviewed  CULTURE, GROUP A STREP ([place])  NOVEL CORONAVIRUS, NAA (HOSPITAL ORDER, SEND-OUT TO REF LAB)  POCT RAPID STREP A    EKG   Radiology No results found.  Procedures Procedures (including critical care time)  Medications Ordered in UC Medications  alum & mag hydroxide-simeth (MAALOX/MYLANTA) 200-200-20 MG/5ML suspension 30 mL (30 mLs Oral Given 04/05/19 1348)    And  lidocaine (XYLOCAINE) 2 % viscous mouth solution 15 mL (15 mLs Oral Given 04/05/19 1348)  lidocaine-EPINEPHrine (XYLOCAINE W/EPI) 2 %-1:200000 (PF) injection (has no administration in time range)  alum & mag hydroxide-simeth (MAALOX/MYLANTA) 200-200-20 MG/5ML suspension (has no administration in  time range)  lidocaine (XYLOCAINE) 2 % viscous mouth solution (has no administration in time range)  ibuprofen (ADVIL) 800 MG tablet (has no administration in time range)    Initial Impression / Assessment and Plan / UC Course  I have reviewed the triage vital signs and the nursing notes.  Pertinent labs & imaging results that were available during my care of the patient were reviewed by me and considered in  my medical decision making (see chart for details).     Afebrile. Non toxic in appearance. Negative rapid strep, culture pending. covid collected and pending. Viral pharyngitis likely. Supportive cares recommended. Return precautions provided. Patient verbalized understanding and agreeable to plan.   Final Clinical Impressions(s) / UC Diagnoses   Final diagnoses:  Pharyngitis, unspecified etiology     Discharge Instructions     Negative rapid strep.  You do have some ulcers visible which I suspect is source of your pain.  I do expect these to improve over the next week.  Will notify of any positive findings from your covid testing. Isolate until this results.  Throat lozenges, gargles, chloraseptic spray, warm teas, popsicles etc to help with throat pain.   If any worsening of symptoms- increased pain, fevers, difficulty swallowing please return or go to the ER.     ED Prescriptions    None     Controlled Substance Prescriptions [place] Controlled Substance Registry consulted? Not Applicable   Georgetta HaberBurky,  B, NP 04/05/19 2144

## 2019-04-05 NOTE — Discharge Instructions (Signed)
Negative rapid strep.  You do have some ulcers visible which I suspect is source of your pain.  I do expect these to improve over the next week.  Will notify of any positive findings from your covid testing. Isolate until this results.  Throat lozenges, gargles, chloraseptic spray, warm teas, popsicles etc to help with throat pain.   If any worsening of symptoms- increased pain, fevers, difficulty swallowing please return or go to the ER.

## 2019-04-06 LAB — NOVEL CORONAVIRUS, NAA (HOSP ORDER, SEND-OUT TO REF LAB; TAT 18-24 HRS): SARS-CoV-2, NAA: NOT DETECTED

## 2019-04-07 LAB — CULTURE, GROUP A STREP (THRC)

## 2019-04-08 ENCOUNTER — Telehealth (HOSPITAL_COMMUNITY): Payer: Self-pay | Admitting: Emergency Medicine

## 2019-04-08 NOTE — Telephone Encounter (Signed)
Patient returned call, states her throat is feeling better, no treatment indicated.

## 2019-04-08 NOTE — Telephone Encounter (Signed)
Culture is positive for non group A Strep germ.  This is a finding of uncertain significance; not the typical 'strep throat' germ.  Attempted to reach patient to see how she was doing. No answer.  Covid negative.

## 2019-05-25 ENCOUNTER — Other Ambulatory Visit: Payer: Self-pay

## 2019-05-25 ENCOUNTER — Encounter (HOSPITAL_COMMUNITY): Payer: Self-pay | Admitting: *Deleted

## 2019-05-25 ENCOUNTER — Ambulatory Visit (HOSPITAL_COMMUNITY)
Admission: EM | Admit: 2019-05-25 | Discharge: 2019-05-25 | Disposition: A | Discharge status: Home / Self Care | Payer: Medicaid Other | Attending: Emergency Medicine | Admitting: Emergency Medicine

## 2019-05-25 DIAGNOSIS — M7631 Iliotibial band syndrome, right leg: Secondary | ICD-10-CM

## 2019-05-25 MED ORDER — IBUPROFEN 600 MG PO TABS: 600.0000 mg | ORAL_TABLET | Freq: Four times a day (QID) | ORAL | 0 refills | Status: DC | PRN

## 2019-05-25 NOTE — ED Provider Notes (Signed)
HPI  SUBJECTIVE:  Gloria Proctor is a 23 y.o. female who presents with right hip/lateral thigh pain for the past 4 days that radiates down to her lateral knee.  She describes it as constant, achy, throbbing, sore.  No fall, trauma, rash, bruising, distal numbness or tingling, foot pain.  No leg weakness.  No back pain.  No change in physical activity.  No fevers, erythema.  States that her knee is fine.  She has had symptoms like this before but usually resolves in a day or 2.  No antipyretic in the past 4 to 6 hours, no recent illness.  She tried ibuprofen 600 mg 2-3 times a day with improvement in her symptoms, symptoms are worse with walking, lying down.  Past medical history negative for diabetes, hypertension, osteoporosis.  LMP: 9/9.  Denies the possibility of being pregnant.  PMD: [facility].    Past Medical History:  Diagnosis Date  . Anxiety   . Depression   . Migraines     Past Surgical History:  Procedure Laterality Date  . MULTIPLE EXTRACTIONS WITH ALVEOLOPLASTY N/A 06/04/2015   Procedure: REMOVAL OF WISDOM TEETH;  Surgeon: Ocie Doyne, DDS;  Location: [facility];  Service: Oral Surgery;  Laterality: N/A;  . TONSILLECTOMY    . TONSILLECTOMY AND ADENOIDECTOMY N/A 08/16/2013   Procedure: TONSILLECTOMY AND ADENOIDECTOMY;  Surgeon: Flo Shanks, MD;  Location: [facility];  Service: ENT;  Laterality: N/A;    Family History  Problem Relation Age of Onset  . Migraines Mother        Started having migraines during adulthood, after child birth  . Heart attack Paternal Grandfather     Social History   Tobacco Use  . Smoking status: Current Some Day Smoker    Types: Cigars    Start date: 03/28/2014  . Smokeless tobacco: Never Used  Substance Use Topics  . Alcohol use: No  . Drug use: No    No current facility-administered medications for this encounter.   Current Outpatient Medications:  .  ALPRAZolam (XANAX) 0.5 MG tablet, Take 1 tablet (0.5 mg total) by mouth at  bedtime as needed for anxiety., Disp: 6 tablet, Rfl: 0 .  fluticasone (FLONASE) 50 MCG/ACT nasal spray, PLACE 1 SPRAY INTO BOTH NOSTRILS DAILY., Disp: 16 g, Rfl: 1 .  hydrOXYzine (ATARAX/VISTARIL) 25 MG tablet, Take 1 tablet (25 mg total) by mouth every 8 (eight) hours as needed for anxiety., Disp: 12 tablet, Rfl: 0 .  ibuprofen (ADVIL) 600 MG tablet, Take 1 tablet (600 mg total) by mouth every 6 (six) hours as needed., Disp: 30 tablet, Rfl: 0  Allergies  Allergen Reactions  . Caffeine Other (See Comments)    Patient has migraines   . Chocolate Other (See Comments)    Reaction:migraines     ROS  As noted in HPI.   Physical Exam  BP (!) 107/55   Pulse 64   Temp 97.9 F (36.6 C) (Other (Comment))   Resp 20   LMP 05/07/2019 (Exact Date)   SpO2 100%   Constitutional: Well developed, well nourished, no acute distress Eyes:  EOMI, conjunctiva normal bilaterally HENT: Normocephalic, atraumatic,mucus membranes moist Respiratory: Normal inspiratory effort Cardiovascular: Normal rate GI: nondistended skin: No rash, skin intact Musculoskeletal: No signs of trauma R hip. No bruising, erythema, rash. Diffuse muscular tenderness down IT band.  No tenderness over gluteal muscles, quadriceps.  Pain with abduction, external rotation of the hip.  No pain with adduction or internal rotation.  No tenderness at sciatic notch. Roll test for muscle spasm negative. Flexion/extension knee WNL. Knee joint NT, stable. Motor strength flexion/ext hip 5/5. Sensation to LT intact. DP 2+ Neurologic: Alert & oriented x 3, no focal neuro deficits Psychiatric: Speech and behavior appropriate   ED Course   Medications - No data to display  No orders of the defined types were placed in this encounter.   No results found for this or any previous visit (from the past 24 hour(s)). No results found.  ED Clinical Impression  1. Iliotibial band syndrome of right side      ED  Assessment/Plan  Presentation consistent with IT band syndrome.  Home with ibuprofen 600 mg combined with 1 g of Tylenol on a regular basis for the next several days, then may back off on it and take it as needed.  She declined a prescription of steroids.  Showed her stretches.  We will have her follow-up with [facility], or with Drs. Janora Norlander or Tamala Julian if not better in a week.  Writing a work note for today and tomorrow.  Patient declined pain medication here  Discussed MDM, treatment plan, and plan for follow-up with patient. Discussed sn/sx that should prompt return to the ED. patient agrees with plan.   Meds ordered this encounter  Medications  . ibuprofen (ADVIL) 600 MG tablet    Sig: Take 1 tablet (600 mg total) by mouth every 6 (six) hours as needed.    Dispense:  30 tablet    Refill:  0    *This clinic note was created using [employer]. Therefore, there may be occasional mistakes despite careful proofreading.   ?    Melynda Ripple, MD 05/26/19 [phone]

## 2019-05-25 NOTE — Discharge Instructions (Addendum)
Do the stretch with your ankle over your knee as I showed you as often as you can.  Take 600 mg of ibuprofen combined with 1 g of Tylenol 3-4 times a day on a regular basis for the next several days, then you may take it as needed.  Follow-up with sports medicine if you are not getting better in a week.

## 2019-05-25 NOTE — ED Triage Notes (Signed)
Denies injury.  C/O right hip pain onset 4 days ago with progressive worsening.  Pain radiates down RLE.  Denies parasthesias.

## 2019-09-15 ENCOUNTER — Ambulatory Visit (HOSPITAL_COMMUNITY)
Admission: EM | Admit: 2019-09-15 | Discharge: 2019-09-15 | Disposition: A | Discharge status: Home / Self Care | Payer: Medicaid Other | Attending: Family Medicine | Admitting: Family Medicine

## 2019-09-15 ENCOUNTER — Encounter (HOSPITAL_COMMUNITY): Payer: Self-pay | Admitting: Emergency Medicine

## 2019-09-15 ENCOUNTER — Other Ambulatory Visit: Payer: Self-pay

## 2019-09-15 DIAGNOSIS — N611 Abscess of the breast and nipple: Secondary | ICD-10-CM | POA: Diagnosis present

## 2019-09-15 DIAGNOSIS — N644 Mastodynia: Secondary | ICD-10-CM

## 2019-09-15 DIAGNOSIS — Z20822 Contact with and (suspected) exposure to covid-19: Secondary | ICD-10-CM | POA: Diagnosis present

## 2019-09-15 MED ORDER — MORPHINE SULFATE (PF) 4 MG/ML IV SOLN
INTRAVENOUS | Status: AC
  Filled 2019-09-15: qty 1

## 2019-09-15 MED ORDER — MORPHINE SULFATE (PF) 2 MG/ML IV SOLN
4.0000 mg | Freq: Once | INTRAVENOUS | Status: AC
  Administered 2019-09-15: 4 mg via INTRAMUSCULAR

## 2019-09-15 MED ORDER — HYDROCODONE-ACETAMINOPHEN 5-325 MG PO TABS: 1.0000 | ORAL_TABLET | Freq: Four times a day (QID) | ORAL | 0 refills | Status: DC | PRN

## 2019-09-15 MED ORDER — DOXYCYCLINE HYCLATE 100 MG PO CAPS: 100.0000 mg | ORAL_CAPSULE | Freq: Two times a day (BID) | ORAL | 0 refills | Status: DC

## 2019-09-15 NOTE — ED Provider Notes (Signed)
[facility]   [id number] 09/15/19 Arrival Time: 9518  ASSESSMENT & PLAN:  1. Abscess of right breast   2. Encounter for screening laboratory testing for COVID-19 virus     COVID testing sent at patient request. No exposures. Asymptomatic.  Incision and Drainage Procedure Note  Anesthesia: 2% plain lidocaine  Procedure Details  RN chaperone present. The procedure, risks and complications have been discussed in detail (including, but not limited to pain and bleeding) with the patient.  The skin induration was prepped and draped in the usual fashion. After adequate local anesthesia, I&D with a #11 blade was performed on the right inferior breast with copious, bloody, purulent drainage. Abscess cavity explored and evacuated. Loculations broken up with a curved hemostat as best as possible given patient discomfort.   EBL: minimal Drains: none Packing: placed Condition: Tolerated procedure well Complications: none.   Meds ordered this encounter  Medications  . morphine 2 MG/ML injection 4 mg    Wound care instructions discussed and given in written format. To return in 48 hours for wound check.  Finish all antibiotics. OTC analgesics as needed.  Reviewed expectations re: course of current medical issues. Questions answered. Outlined signs and symptoms indicating need for more acute intervention. Patient verbalized understanding. After Visit Summary given.   SUBJECTIVE:  Gloria Proctor is a 24 y.o. female who presents with a possible infection of her inferior R breast. Onset gradual, with pain at location noted approx 2 w ago; increasing pain brings her in today. Without active drainage and without active bleeding. Fever: absent. OTC/home treatment: warm compresses without relief. H/O skin abscess requiring I&D; axilla. No injury to area.  She also requests COVID testing. No exposure. No symptoms.  OBJECTIVE:  Vitals:   09/15/19 0949  BP: 113/65  Pulse:  81  Resp: 16  Temp: 99.3 F (37.4 C)  TempSrc: Oral  SpO2: 100%     General appearance: alert; appears to be in pain, esp with manipulation of R breast R breast: approx 2-3 cm induration of her R inferior breast; exquisitely tender to touch; no active drainage or bleeding Psychological: alert and cooperative; normal mood and affect   Allergies  Allergen Reactions  . Caffeine Other (See Comments)    Patient has migraines   . Chocolate Other (See Comments)    Reaction:migraines    Past Medical History:  Diagnosis Date  . Anxiety   . Depression   . Migraines    Social History   Socioeconomic History  . Marital status: Single    Spouse name: Not on file  . Number of children: Not on file  . Years of education: Not on file  . Highest education level: Not on file  Occupational History  . Not on file  Tobacco Use  . Smoking status: Current Some Day Smoker    Types: Cigars    Start date: 03/28/2014  . Smokeless tobacco: Never Used  Substance and Sexual Activity  . Alcohol use: No  . Drug use: No  . Sexual activity: Yes    Birth control/protection: Condom  Other Topics Concern  . Not on file  Social History Narrative  . Not on file   Social Determinants of Health   Financial Resource Strain:   . Difficulty of Paying Living Expenses: Not on file  Food Insecurity:   . Worried About Charity fundraiser in the Last Year: Not on file  . Ran Out of Food in the Last Year: Not on  file  Transportation Needs:   . Freight forwarder (Medical): Not on file  . Lack of Transportation (Non-Medical): Not on file  Physical Activity:   . Days of Exercise per Week: Not on file  . Minutes of Exercise per Session: Not on file  Stress:   . Feeling of Stress : Not on file  Social Connections:   . Frequency of Communication with Friends and Family: Not on file  . Frequency of Social Gatherings with Friends and Family: Not on file  . Attends Religious Services: Not on file  .  Active Member of Clubs or Organizations: Not on file  . Attends Banker Meetings: Not on file  . Marital Status: Not on file   Family History  Problem Relation Age of Onset  . Migraines Mother        Started having migraines during adulthood, after child birth  . Heart attack Paternal Grandfather    Past Surgical History:  Procedure Laterality Date  . MULTIPLE EXTRACTIONS WITH ALVEOLOPLASTY N/A 06/04/2015   Procedure: REMOVAL OF WISDOM TEETH;  Surgeon: Ocie Doyne, DDS;  Location: [facility];  Service: Oral Surgery;  Laterality: N/A;  . TONSILLECTOMY    . TONSILLECTOMY AND ADENOIDECTOMY N/A 08/16/2013   Procedure: TONSILLECTOMY AND ADENOIDECTOMY;  Surgeon: Flo Shanks, MD;  Location: [facility];  Service: ENT;  Laterality: Vertis Kelch, MD 09/15/19 1049

## 2019-09-15 NOTE — Discharge Instructions (Addendum)
You have had an abscess drained today and you may have had packing placed in the wound to help the abscess continue to drain at home. If packing was placed, please do not remove it. You may shower with the packing in place. Let the soapy water clean your wound. Do not scrub it. Keep your wound covered. Follow up at the Urgent Care in 2 days for a wound check and/or packing removal. Return to the Urgent Care immediately if you develop any of the following symptoms: fever, Increased redness or swelling around where your abscess was, increased pain, or generalized weakness or vomiting. ° °Be aware, pain medications may cause drowsiness. Please do not drive, operate heavy machinery or make important decisions while on this medication, it can cloud your judgement. °

## 2019-09-15 NOTE — ED Triage Notes (Signed)
PT reports pain, swelling, redness in right breast for a few days. Got worse last night. Not breastfeeding. No recent pregnancy.

## 2019-09-15 NOTE — ED Triage Notes (Signed)
Also requests COVID testing.

## 2019-09-17 ENCOUNTER — Encounter (HOSPITAL_COMMUNITY): Payer: Self-pay

## 2019-09-17 ENCOUNTER — Other Ambulatory Visit: Payer: Self-pay

## 2019-09-17 ENCOUNTER — Ambulatory Visit (HOSPITAL_COMMUNITY)
Admission: EM | Admit: 2019-09-17 | Discharge: 2019-09-17 | Disposition: A | Discharge status: Home / Self Care | Payer: Medicaid Other | Attending: Family Medicine | Admitting: Family Medicine

## 2019-09-17 DIAGNOSIS — N611 Abscess of the breast and nipple: Secondary | ICD-10-CM

## 2019-09-17 DIAGNOSIS — Z5189 Encounter for other specified aftercare: Secondary | ICD-10-CM

## 2019-09-17 LAB — NOVEL CORONAVIRUS, NAA (HOSP ORDER, SEND-OUT TO REF LAB; TAT 18-24 HRS): SARS-CoV-2, NAA: NOT DETECTED

## 2019-09-17 NOTE — ED Triage Notes (Signed)
Pt states she packing in a wound underneath her right breast. Pt states she thinks its time for it to come out.

## 2019-09-18 NOTE — ED Provider Notes (Signed)
[facility]   [id number] 09/17/19 Arrival Time: 2355  ASSESSMENT & PLAN:  1. Visit for wound check     Wound looks good. Packing removed. Continue and finish antibiotics. Pain under control. Will f/u if needed.  Reviewed expectations re: course of current medical issues. Questions answered. Outlined signs and symptoms indicating need for more acute intervention. Patient verbalized understanding. After Visit Summary given.   SUBJECTIVE:  Gloria Proctor is a 24 y.o. female who returns for wound check s/p I&D by me two d ago. No concerns. Still with some drainage. Pain controlled; improving. Afebrile.  ROS: As per HPI.  OBJECTIVE:  Vitals:   09/17/19 1746  BP: 98/64  Pulse: (!) 101  Resp: 18  Temp: 98.2 F (36.8 C)  TempSrc: Oral  SpO2: 99%     General appearance: alert; no distress Packing removed from I&D site; no complications; no bleeding; slight purulent drainage Psychological: alert and cooperative; normal mood and affect  Allergies  Allergen Reactions  . Caffeine Other (See Comments)    Patient has migraines   . Chocolate Other (See Comments)    Reaction:migraines    Past Medical History:  Diagnosis Date  . Anxiety   . Depression   . Migraines    Social History   Socioeconomic History  . Marital status: Single    Spouse name: Not on file  . Number of children: Not on file  . Years of education: Not on file  . Highest education level: Not on file  Occupational History  . Not on file  Tobacco Use  . Smoking status: Current Some Day Smoker    Types: Cigars    Start date: 03/28/2014  . Smokeless tobacco: Never Used  Substance and Sexual Activity  . Alcohol use: No  . Drug use: No  . Sexual activity: Yes    Birth control/protection: Condom  Other Topics Concern  . Not on file  Social History Narrative  . Not on file   Social Determinants of Health   Financial Resource Strain:   . Difficulty of Paying Living Expenses: Not  on file  Food Insecurity:   . Worried About Charity fundraiser in the Last Year: Not on file  . Ran Out of Food in the Last Year: Not on file  Transportation Needs:   . Lack of Transportation (Medical): Not on file  . Lack of Transportation (Non-Medical): Not on file  Physical Activity:   . Days of Exercise per Week: Not on file  . Minutes of Exercise per Session: Not on file  Stress:   . Feeling of Stress : Not on file  Social Connections:   . Frequency of Communication with Friends and Family: Not on file  . Frequency of Social Gatherings with Friends and Family: Not on file  . Attends Religious Services: Not on file  . Active Member of Clubs or Organizations: Not on file  . Attends Archivist Meetings: Not on file  . Marital Status: Not on file   Family History  Problem Relation Age of Onset  . Migraines Mother        Started having migraines during adulthood, after child birth  . Heart attack Paternal Grandfather    Past Surgical History:  Procedure Laterality Date  . MULTIPLE EXTRACTIONS WITH ALVEOLOPLASTY N/A 06/04/2015   Procedure: REMOVAL OF WISDOM TEETH;  Surgeon: Diona Browner, DDS;  Location: [facility];  Service: Oral Surgery;  Laterality: N/A;  . TONSILLECTOMY    .  TONSILLECTOMY AND ADENOIDECTOMY N/A 08/16/2013   Procedure: TONSILLECTOMY AND ADENOIDECTOMY;  Surgeon: Flo Shanks, MD;  Location: [facility];  Service: ENT;  Laterality: Vertis Kelch, MD 09/18/19 1227

## 2019-12-26 ENCOUNTER — Ambulatory Visit (HOSPITAL_COMMUNITY)
Admission: EM | Admit: 2019-12-26 | Discharge: 2019-12-26 | Disposition: A | Discharge status: Home / Self Care | Payer: Medicaid Other | Attending: Family Medicine | Admitting: Family Medicine

## 2019-12-26 ENCOUNTER — Other Ambulatory Visit: Payer: Self-pay

## 2019-12-26 ENCOUNTER — Encounter (HOSPITAL_COMMUNITY): Payer: Self-pay

## 2019-12-26 DIAGNOSIS — Z202 Contact with and (suspected) exposure to infections with a predominantly sexual mode of transmission: Secondary | ICD-10-CM

## 2019-12-26 DIAGNOSIS — Z3202 Encounter for pregnancy test, result negative: Secondary | ICD-10-CM | POA: Diagnosis not present

## 2019-12-26 DIAGNOSIS — A549 Gonococcal infection, unspecified: Secondary | ICD-10-CM

## 2019-12-26 DIAGNOSIS — Z7689 Persons encountering health services in other specified circumstances: Secondary | ICD-10-CM | POA: Diagnosis not present

## 2019-12-26 LAB — POCT URINALYSIS DIP (DEVICE)
Glucose, UA: NEGATIVE mg/dL
Hgb urine dipstick: NEGATIVE
Leukocytes,Ua: NEGATIVE
Nitrite: NEGATIVE
Protein, ur: 30 mg/dL — AB
Specific Gravity, Urine: >=1.03 (ref 1.005–1.030)
Urobilinogen, UA: 0.2 mg/dL (ref 0.0–1.0)
pH: 5.5 (ref 5.0–8.0)

## 2019-12-26 LAB — POC URINE PREG, ED: Preg Test, Ur: NEGATIVE

## 2019-12-26 MED ORDER — CEFTRIAXONE SODIUM 500 MG IJ SOLR
INTRAMUSCULAR | Status: AC
  Filled 2019-12-26: qty 500

## 2019-12-26 MED ORDER — CEFTRIAXONE SODIUM 500 MG IJ SOLR
500.0000 mg | Freq: Once | INTRAMUSCULAR | Status: AC
  Administered 2019-12-26: 500 mg via INTRAMUSCULAR

## 2019-12-26 MED ORDER — METRONIDAZOLE 500 MG PO TABS: 2000.0000 mg | ORAL_TABLET | Freq: Two times a day (BID) | ORAL | 0 refills | Status: AC

## 2019-12-26 NOTE — ED Triage Notes (Signed)
Pt states she has pelvic pain and her sexual partner tested + for trich and gonorrhea. Pt denies discharge.

## 2019-12-26 NOTE — ED Provider Notes (Addendum)
[facility]    CSN: [id number] Arrival date & time: 12/26/19  1452      History   Chief Complaint Chief Complaint  Patient presents with  . STI exposure    HPI Gloria Proctor is a 24 y.o. female.   HPI STD exposure. Reports sexual partner recently reported that he tested positive for gonorrhea and trichomonas.  She endorses sexual contact with partner within the last 2 weeks although endorses use of barrier protection.  Prior to that she has been sexually active without barrier protection with the same partner.  She is asymptomatic today.  She requests STD testing and treatment due to exposure. Past Medical History:  Diagnosis Date  . Anxiety   . Depression   . Migraines     Patient Active Problem List   Diagnosis Date Noted  . Breast abscess 11/23/2017  . Major depressive disorder, recurrent severe without psychotic features (HCC) 01/27/2016  . Cannabis use disorder, moderate, dependence (HCC) 01/27/2016  . Tobacco use disorder 01/27/2016  . Amenorrhea 07/19/2015  . Hidradenitis suppurativa 12/21/2014  . Migraine without aura and without status migrainosus, not intractable 08/15/2013  . Obesity 07/02/2008  . RHINITIS, ALLERGIC 10/25/2006    Past Surgical History:  Procedure Laterality Date  . MULTIPLE EXTRACTIONS WITH ALVEOLOPLASTY N/A 06/04/2015   Procedure: REMOVAL OF WISDOM TEETH;  Surgeon: Ocie Doyne, DDS;  Location: [facility];  Service: Oral Surgery;  Laterality: N/A;  . TONSILLECTOMY    . TONSILLECTOMY AND ADENOIDECTOMY N/A 08/16/2013   Procedure: TONSILLECTOMY AND ADENOIDECTOMY;  Surgeon: Flo Shanks, MD;  Location: [facility];  Service: ENT;  Laterality: N/A;    OB History   No obstetric history on file.      Home Medications    Prior to Admission medications   Medication Sig Start Date End Date Taking? Authorizing Provider  ALPRAZolam Prudy Feeler) 0.5 MG tablet Take 1 tablet (0.5 mg total) by mouth at bedtime as needed for anxiety. 11/07/16    Elson Areas, PA-C  doxycycline (VIBRAMYCIN) 100 MG capsule Take 1 capsule (100 mg total) by mouth 2 (two) times daily. 09/15/19   Mardella Layman, MD  fluticasone (FLONASE) 50 MCG/ACT nasal spray PLACE 1 SPRAY INTO BOTH NOSTRILS DAILY. 12/21/16   Rumley, Lora Havens, DO  HYDROcodone-acetaminophen (NORCO/VICODIN) 5-325 MG tablet Take 1 tablet by mouth every 6 (six) hours as needed for moderate pain or severe pain. 09/15/19   Mardella Layman, MD  hydrOXYzine (ATARAX/VISTARIL) 25 MG tablet Take 1 tablet (25 mg total) by mouth every 8 (eight) hours as needed for anxiety. 11/07/16   Elson Areas, PA-C  ibuprofen (ADVIL) 600 MG tablet Take 1 tablet (600 mg total) by mouth every 6 (six) hours as needed. 05/25/19   Domenick Gong, MD    Family History Family History  Problem Relation Age of Onset  . Migraines Mother        Started having migraines during adulthood, after child birth  . Heart attack Paternal Grandfather     Social History Social History   Tobacco Use  . Smoking status: Current Some Day Smoker    Types: Cigars    Start date: 03/28/2014  . Smokeless tobacco: Never Used  Substance Use Topics  . Alcohol use: No  . Drug use: No     Allergies   Caffeine and Chocolate   Review of Systems Review of Systems Pertinent negatives listed in HPI  Physical Exam Triage Vital Signs ED Triage Vitals [12/26/19 1509]  Enc Vitals  Group     BP 120/76     Pulse Rate 76     Resp 16     Temp 98.9 F (37.2 C)     Temp Source Oral     SpO2 98 %     Weight 270 lb (122.5 kg)     Height 5' 3.5" (1.613 m)     Head Circumference      Peak Flow      Pain Score 3     Pain Loc      Pain Edu?      Excl. in [place]?    No data found.  Updated Vital Signs BP 120/76   Pulse 76   Temp 98.9 F (37.2 C) (Oral)   Resp 16   Ht 5' 3.5" (1.613 m)   Wt 270 lb (122.5 kg)   SpO2 98%   BMI 47.08 kg/m   Visual Acuity Right Eye Distance:   Left Eye Distance:   Bilateral Distance:    Right  Eye Near:   Left Eye Near:    Bilateral Near:     Physical Exam General appearance: alert, well developed, well nourished, cooperative and in no distress Head: Normocephalic, without obvious abnormality, atraumatic Respiratory: Respirations even and unlabored, normal respiratory rate Heart: rate and rhythm normal. No gallop or murmurs noted on exam  Extremities: No gross deformities Skin: Skin color, texture, turgor normal. No rashes seen  Psych: Appropriate mood and affect. Neurologic: Alert, oriented to person, place, and time, thought content appropriate. Genitourinary: Patient self-collected vaginal specimen UC Treatments / Results  Labs (all labs ordered are listed, but only abnormal results are displayed) Labs Reviewed  POCT URINALYSIS DIP (DEVICE) - Abnormal; Notable for the following components:      Result Value   Bilirubin Urine SMALL (*)    Ketones, ur TRACE (*)    Protein, ur 30 (*)    All other components within normal limits  POC URINE PREG, ED  CERVICOVAGINAL ANCILLARY ONLY    EKG  Radiology No results found.  Procedures Procedures (including critical care time)  Medications Ordered in UC Medications  cefTRIAXone (ROCEPHIN) injection 500 mg (500 mg Intramuscular Given 12/26/19 1626)    Initial Impression / Assessment and Plan / UC Course  I have reviewed the triage vital signs and the nursing notes.  Pertinent labs & imaging results that were available during my care of the patient were reviewed by me and considered in my medical decision making (see chart for details).    1. Potential exposure to STD exposure -Vaginal cytology pending  -Urine pregnancy negative -Treating empirically for Gonorrhea with Rocephin 500 mg IM administer in clinic.  -Treating trichomonas with 4000 mg metronidazole, take 2000 mg twice daily once to complete treatment.  Avoid alcohol while taking medication. Patient would like to defer treatment for Chlamydia until cytology  results.  Final Clinical Impressions(s) / UC Diagnoses   Final diagnoses:  Encounter for assessment of STD exposure   Discharge Instructions   None    ED Prescriptions    Medication Sig Dispense Auth. Provider   metroNIDAZOLE (FLAGYL) 500 MG tablet Take 4 tablets (2,000 mg total) by mouth 2 (two) times daily for 1 day. 8 tablet Scot Jun, FNP     PDMP not reviewed this encounter.   Scot Jun, FNP 12/27/19 [place], [place], FNP 12/27/19 [phone]

## 2019-12-29 LAB — CERVICOVAGINAL ANCILLARY ONLY
Bacterial Vaginitis (gardnerella): POSITIVE — AB
Candida Glabrata: NEGATIVE
Candida Vaginitis: NEGATIVE
Chlamydia: NEGATIVE
Comment: NEGATIVE
Comment: NEGATIVE
Comment: NEGATIVE
Comment: NEGATIVE
Comment: NEGATIVE
Comment: NORMAL
Neisseria Gonorrhea: POSITIVE — AB
Trichomonas: POSITIVE — AB

## 2020-02-23 ENCOUNTER — Ambulatory Visit (HOSPITAL_COMMUNITY)
Admission: EM | Admit: 2020-02-23 | Discharge: 2020-02-23 | Disposition: A | Discharge status: Home / Self Care | Payer: Medicaid Other | Attending: Family Medicine | Admitting: Family Medicine

## 2020-02-23 ENCOUNTER — Encounter (HOSPITAL_COMMUNITY): Payer: Self-pay

## 2020-02-23 DIAGNOSIS — Z20822 Contact with and (suspected) exposure to covid-19: Secondary | ICD-10-CM | POA: Diagnosis not present

## 2020-02-23 DIAGNOSIS — J069 Acute upper respiratory infection, unspecified: Secondary | ICD-10-CM | POA: Insufficient documentation

## 2020-02-23 NOTE — ED Provider Notes (Signed)
[facility]    CSN: [id number] Arrival date & time: 02/23/20  1018      History   Chief Complaint Chief Complaint  Patient presents with  . Nasal Congestion  . Cough    HPI Gloria Proctor is a 24 y.o. female.   The history is provided by the patient. No language interpreter was used.  Cough Cough characteristics:  Productive Sputum characteristics:  Nondescript Severity:  Moderate Onset quality:  Gradual Timing:  Constant Progression:  Worsening Chronicity:  New Context: sick contacts   Relieved by:  Nothing Worsened by:  Nothing Associated symptoms: fever and myalgias     Past Medical History:  Diagnosis Date  . Anxiety   . Depression   . Migraines     Patient Active Problem List   Diagnosis Date Noted  . Breast abscess 11/23/2017  . Major depressive disorder, recurrent severe without psychotic features ([place]) 01/27/2016  . Cannabis use disorder, moderate, dependence ([place]) 01/27/2016  . Tobacco use disorder 01/27/2016  . Amenorrhea 07/19/2015  . Hidradenitis suppurativa 12/21/2014  . Migraine without aura and without status migrainosus, not intractable 08/15/2013  . Obesity 07/02/2008  . RHINITIS, ALLERGIC 10/25/2006    Past Surgical History:  Procedure Laterality Date  . MULTIPLE EXTRACTIONS WITH ALVEOLOPLASTY N/A 06/04/2015   Procedure: REMOVAL OF WISDOM TEETH;  Surgeon: Diona Browner, DDS;  Location: [facility];  Service: Oral Surgery;  Laterality: N/A;  . TONSILLECTOMY    . TONSILLECTOMY AND ADENOIDECTOMY N/A 08/16/2013   Procedure: TONSILLECTOMY AND ADENOIDECTOMY;  Surgeon: Jodi Marble, MD;  Location: [facility];  Service: ENT;  Laterality: N/A;    OB History   No obstetric history on file.      Home Medications    Prior to Admission medications   Medication Sig Start Date End Date Taking? Authorizing Provider  ALPRAZolam Duanne Moron) 0.5 MG tablet Take 1 tablet (0.5 mg total) by mouth at bedtime as needed for anxiety. 11/07/16   Fransico Meadow, PA-C  doxycycline (VIBRAMYCIN) 100 MG capsule Take 1 capsule (100 mg total) by mouth 2 (two) times daily. 09/15/19   Vanessa Kick, MD  fluticasone (FLONASE) 50 MCG/ACT nasal spray PLACE 1 SPRAY INTO BOTH NOSTRILS DAILY. 12/21/16   Rumley, Burna Cash, DO  HYDROcodone-acetaminophen (NORCO/VICODIN) 5-325 MG tablet Take 1 tablet by mouth every 6 (six) hours as needed for moderate pain or severe pain. 09/15/19   Vanessa Kick, MD  hydrOXYzine (ATARAX/VISTARIL) 25 MG tablet Take 1 tablet (25 mg total) by mouth every 8 (eight) hours as needed for anxiety. 11/07/16   Fransico Meadow, PA-C  ibuprofen (ADVIL) 600 MG tablet Take 1 tablet (600 mg total) by mouth every 6 (six) hours as needed. 05/25/19   Melynda Ripple, MD    Family History Family History  Problem Relation Age of Onset  . Migraines Mother        Started having migraines during adulthood, after child birth  . Heart attack Paternal Grandfather     Social History Social History   Tobacco Use  . Smoking status: Current Some Day Smoker    Types: Cigars    Start date: 03/28/2014  . Smokeless tobacco: Never Used  Vaping Use  . Vaping Use: Never used  Substance Use Topics  . Alcohol use: No  . Drug use: No     Allergies   Caffeine and Chocolate   Review of Systems Review of Systems  Constitutional: Positive for fever.  Respiratory: Positive for cough.  Musculoskeletal: Positive for myalgias.     Physical Exam Triage Vital Signs ED Triage Vitals  Enc Vitals Group     BP 02/23/20 1131 (!) 127/98     Pulse Rate 02/23/20 1131 86     Resp 02/23/20 1131 18     Temp 02/23/20 1131 100 F (37.8 C)     Temp Source 02/23/20 1131 Oral     SpO2 02/23/20 1131 100 %     Weight --      Height --      Head Circumference --      Peak Flow --      Pain Score 02/23/20 1132 7     Pain Loc --      Pain Edu? --      Excl. in [place]? --    No data found.  Updated Vital Signs BP (!) 127/98 (BP Location: Right Wrist)   Pulse  86   Temp 100 F (37.8 C) (Oral)   Resp 18   LMP 02/08/2020   SpO2 100%   Visual Acuity Right Eye Distance:   Left Eye Distance:   Bilateral Distance:    Right Eye Near:   Left Eye Near:    Bilateral Near:     Physical Exam Vitals and nursing note reviewed.  Constitutional:      Appearance: She is well-developed.  HENT:     Head: Normocephalic.     Nose: Congestion present.  Pulmonary:     Effort: Pulmonary effort is normal.  Abdominal:     General: There is no distension.  Musculoskeletal:        General: Normal range of motion.     Cervical back: Normal range of motion.  Skin:    General: Skin is warm.  Neurological:     Mental Status: She is alert and oriented to person, place, and time.  Psychiatric:        Mood and Affect: Mood normal.      UC Treatments / Results  Labs (all labs ordered are listed, but only abnormal results are displayed) Labs Reviewed  SARS CORONAVIRUS 2 (TAT 6-24 HRS)    EKG   Radiology No results found.  Procedures Procedures (including critical care time)  Medications Ordered in UC Medications - No data to display  Initial Impression / Assessment and Plan / UC Course  I have reviewed the triage vital signs and the nursing notes.  Pertinent labs & imaging results that were available during my care of the patient were reviewed by me and considered in my medical decision making (see chart for details).     MDM:  Pt advised covid swb will result in 6-24 hours.  Pt counseled on viral illness, Final Clinical Impressions(s) / UC Diagnoses   Final diagnoses:  Viral upper respiratory tract infection     Discharge Instructions     Return if any problem  Tylenol every 4 hours.  Your covid test is pending    ED Prescriptions    None     PDMP not reviewed this encounter.  An After Visit Summary was printed and given to the patient.    Elson Areas, [place] 02/23/20 1520

## 2020-02-23 NOTE — Discharge Instructions (Signed)
Return if any problem  Tylenol every 4 hours.  Your covid test is pending

## 2020-02-23 NOTE — ED Triage Notes (Signed)
Pt c/o acute productive cough with yellow sputum, congestion, scratchy throat, body aches, chills, subjective fever and mild SOB. Denies abdom pain, n/v/d or dysuria symptoms. States she drank a lot of tea last night and then awake with urination during the night.  Pt reports that she smokes approx 3 cigarettes/day.  No OTC remedies used.

## 2020-02-24 LAB — SARS CORONAVIRUS 2 (TAT 6-24 HRS): SARS Coronavirus 2: NEGATIVE

## 2020-05-16 ENCOUNTER — Ambulatory Visit (HOSPITAL_COMMUNITY)
Admission: EM | Admit: 2020-05-16 | Discharge: 2020-05-16 | Disposition: A | Discharge status: Home / Self Care | Payer: HRSA Program | Attending: Emergency Medicine | Admitting: Emergency Medicine

## 2020-05-16 ENCOUNTER — Encounter (HOSPITAL_COMMUNITY): Payer: Self-pay | Admitting: *Deleted

## 2020-05-16 DIAGNOSIS — E669 Obesity, unspecified: Secondary | ICD-10-CM | POA: Diagnosis not present

## 2020-05-16 DIAGNOSIS — F419 Anxiety disorder, unspecified: Secondary | ICD-10-CM | POA: Insufficient documentation

## 2020-05-16 DIAGNOSIS — Z87891 Personal history of nicotine dependence: Secondary | ICD-10-CM | POA: Diagnosis not present

## 2020-05-16 DIAGNOSIS — J069 Acute upper respiratory infection, unspecified: Secondary | ICD-10-CM | POA: Diagnosis not present

## 2020-05-16 DIAGNOSIS — F332 Major depressive disorder, recurrent severe without psychotic features: Secondary | ICD-10-CM | POA: Insufficient documentation

## 2020-05-16 DIAGNOSIS — Z20822 Contact with and (suspected) exposure to covid-19: Secondary | ICD-10-CM | POA: Diagnosis not present

## 2020-05-16 LAB — SARS CORONAVIRUS 2 (TAT 6-24 HRS): SARS Coronavirus 2: NEGATIVE

## 2020-05-16 MED ORDER — PREDNISONE 20 MG PO TABS: 40.0000 mg | ORAL_TABLET | Freq: Every day | ORAL | 0 refills | Status: AC

## 2020-05-16 MED ORDER — ALBUTEROL SULFATE HFA 108 (90 BASE) MCG/ACT IN AERS
INHALATION_SPRAY | RESPIRATORY_TRACT | Status: AC
  Filled 2020-05-16: qty 6.7

## 2020-05-16 MED ORDER — BENZONATATE 200 MG PO CAPS: 200.0000 mg | ORAL_CAPSULE | Freq: Three times a day (TID) | ORAL | 0 refills | Status: AC | PRN

## 2020-05-16 MED ORDER — ALBUTEROL SULFATE HFA 108 (90 BASE) MCG/ACT IN AERS
2.0000 | INHALATION_SPRAY | Freq: Once | RESPIRATORY_TRACT | Status: AC
  Administered 2020-05-16: 2 via RESPIRATORY_TRACT

## 2020-05-16 MED ORDER — FLUTICASONE PROPIONATE 50 MCG/ACT NA SUSP: 1.0000 | Freq: Every day | NASAL | 0 refills | Status: AC

## 2020-05-16 NOTE — ED Triage Notes (Addendum)
C/O starting with mild throat irritation 4 days ago; 2 days ago started with cough, nasal congestion, bilat ear pressure.  Denies fevers.

## 2020-05-16 NOTE — ED Provider Notes (Signed)
[facility]    CSN: [id number] Arrival date & time: 05/16/20  1125      History   Chief Complaint Chief Complaint  Patient presents with  . Cough  . Nasal Congestion    HPI Gloria Proctor is a 24 y.o. female presenting today for evaluation of URI symptoms.  Patient reports over 4 days ago began to develop sore throat/irritation, over the past 2 days has developed cough congestion shortness of breath and ear pressure.  She denies any known fevers.  Denies close sick contacts.  Denies GI symptoms.  Using over-the-counter DayQuil and drinking hot tea.  HPI  Past Medical History:  Diagnosis Date  . Anxiety   . Depression   . Migraines     Patient Active Problem List   Diagnosis Date Noted  . Breast abscess 11/23/2017  . Major depressive disorder, recurrent severe without psychotic features (HCC) 01/27/2016  . Cannabis use disorder, moderate, dependence (HCC) 01/27/2016  . Tobacco use disorder 01/27/2016  . Amenorrhea 07/19/2015  . Hidradenitis suppurativa 12/21/2014  . Migraine without aura and without status migrainosus, not intractable 08/15/2013  . Obesity 07/02/2008  . RHINITIS, ALLERGIC 10/25/2006    Past Surgical History:  Procedure Laterality Date  . MULTIPLE EXTRACTIONS WITH ALVEOLOPLASTY N/A 06/04/2015   Procedure: REMOVAL OF WISDOM TEETH;  Surgeon: Ocie Doyne, DDS;  Location: [facility];  Service: Oral Surgery;  Laterality: N/A;  . TONSILLECTOMY    . TONSILLECTOMY AND ADENOIDECTOMY N/A 08/16/2013   Procedure: TONSILLECTOMY AND ADENOIDECTOMY;  Surgeon: Flo Shanks, MD;  Location: [facility];  Service: ENT;  Laterality: N/A;    OB History   No obstetric history on file.      Home Medications    Prior to Admission medications   Medication Sig Start Date End Date Taking? Authorizing Provider  benzonatate (TESSALON) 200 MG capsule Take 1 capsule (200 mg total) by mouth 3 (three) times daily as needed for up to 7 days for cough. 05/16/20 05/23/20   Miryah Ralls C, PA-C  fluticasone (FLONASE) 50 MCG/ACT nasal spray Place 1-2 sprays into both nostrils daily for 7 days. 05/16/20 05/23/20  Raffaella Edison C, PA-C  predniSONE (DELTASONE) 20 MG tablet Take 2 tablets (40 mg total) by mouth daily for 5 days. 05/16/20 05/21/20  Brailyn Delman, Junius Creamer, PA-C    Family History Family History  Problem Relation Age of Onset  . Migraines Mother        Started having migraines during adulthood, after child birth  . Heart attack Paternal Grandfather     Social History Social History   Tobacco Use  . Smoking status: Former Smoker    Types: Cigarettes    Start date: 03/28/2014  . Smokeless tobacco: Never Used  . Tobacco comment: quit 3 wks ago  Vaping Use  . Vaping Use: Never used  Substance Use Topics  . Alcohol use: No  . Drug use: No     Allergies   Caffeine and Chocolate   Review of Systems Review of Systems  Constitutional: Negative for activity change, appetite change, chills, fatigue and fever.  HENT: Positive for congestion, ear pain, rhinorrhea, sinus pressure and sore throat. Negative for trouble swallowing.   Eyes: Negative for discharge and redness.  Respiratory: Positive for cough. Negative for chest tightness and shortness of breath.   Cardiovascular: Negative for chest pain.  Gastrointestinal: Negative for abdominal pain, diarrhea, nausea and vomiting.  Musculoskeletal: Negative for myalgias.  Skin: Negative for rash.  Neurological: Negative for  dizziness, light-headedness and headaches.     Physical Exam Triage Vital Signs ED Triage Vitals [05/16/20 1255]  Enc Vitals Group     BP 108/76     Pulse Rate 71     Resp 16     Temp 98.7 F (37.1 C)     Temp Source Oral     SpO2 98 %     Weight      Height      Head Circumference      Peak Flow      Pain Score 0     Pain Loc      Pain Edu?      Excl. in GC?    No data found.  Updated Vital Signs BP 108/76   Pulse 71   Temp 98.7 F (37.1 C) (Oral)   Resp  16   LMP 04/18/2020 (Approximate)   SpO2 98%   Visual Acuity Right Eye Distance:   Left Eye Distance:   Bilateral Distance:    Right Eye Near:   Left Eye Near:    Bilateral Near:     Physical Exam Vitals and nursing note reviewed.  Constitutional:      Appearance: She is well-developed.     Comments: No acute distress  HENT:     Head: Normocephalic and atraumatic.     Ears:     Comments: Bilateral ears without tenderness to palpation of external auricle, tragus and mastoid, EAC's without erythema or swelling, TM's with good bony landmarks and cone of light. Non erythematous.     Nose: Nose normal.     Mouth/Throat:     Comments: Oral mucosa pink and moist, no tonsillar enlargement or exudate. Posterior pharynx patent and nonerythematous, no uvula deviation or swelling. Normal phonation. Eyes:     Conjunctiva/sclera: Conjunctivae normal.  Cardiovascular:     Rate and Rhythm: Normal rate.  Pulmonary:     Effort: Pulmonary effort is normal. No respiratory distress.     Breath sounds: Wheezing present.     Comments: Breathing comfortably at rest, mild faint wheezing noted on expiration, rales or other adventitious sounds auscultated Abdominal:     General: There is no distension.  Musculoskeletal:        General: Normal range of motion.     Cervical back: Neck supple.  Skin:    General: Skin is warm and dry.  Neurological:     Mental Status: She is alert and oriented to person, place, and time.      UC Treatments / Results  Labs (all labs ordered are listed, but only abnormal results are displayed) Labs Reviewed  SARS CORONAVIRUS 2 (TAT 6-24 HRS)    EKG   Radiology No results found.  Procedures Procedures (including critical care time)  Medications Ordered in UC Medications  albuterol (VENTOLIN HFA) 108 (90 Base) MCG/ACT inhaler 2 puff (has no administration in time range)    Initial Impression / Assessment and Plan / UC Course  I have reviewed the  triage vital signs and the nursing notes.  Pertinent labs & imaging results that were available during my care of the patient were reviewed by me and considered in my medical decision making (see chart for details).     Covid PCR pending, suspect likely viral etiology, possible early bronchitis, recommending symptomatic and supportive care, providing albuterol and prednisone to help with inflammation in chest, rest and fluids, continue to monitor,Discussed strict return precautions. Patient verbalized understanding and is agreeable with plan.  Final Clinical Impressions(s) / UC Diagnoses   Final diagnoses:  Viral URI with cough     Discharge Instructions     Covid test pending, monitor my chart for results Prednisone 40 mg daily for the next 5 days-take with food and in the morning Albuterol inhaler for shortness of breath, chest tightness or wheezing-1 to 2 puffs every 4-6 hours May get Mucinex DM over-the-counter twice daily May use benzonatate/Tessalon every 8 hours for cough or may use over-the-counter Robitussin, Delsym or Dimetapp if cheaper Flonase nasal spray 1 to 2 spray in each nostril daily to help with sinus pressure nasal congestion and ear pressure Rest and fluids Follow-up if not improving or worsening     ED Prescriptions    Medication Sig Dispense Auth. Provider   predniSONE (DELTASONE) 20 MG tablet Take 2 tablets (40 mg total) by mouth daily for 5 days. 10 tablet Shanyn Preisler C, PA-C   benzonatate (TESSALON) 200 MG capsule Take 1 capsule (200 mg total) by mouth 3 (three) times daily as needed for up to 7 days for cough. 28 capsule Andrea Colglazier C, PA-C   fluticasone (FLONASE) 50 MCG/ACT nasal spray Place 1-2 sprays into both nostrils daily for 7 days. 1 g Fredia Chittenden, [place] C, PA-C     PDMP not reviewed this encounter.   Lew Dawes, [place] 05/16/20 1317

## 2020-05-16 NOTE — Discharge Instructions (Signed)
Covid test pending, monitor my chart for results Prednisone 40 mg daily for the next 5 days-take with food and in the morning Albuterol inhaler for shortness of breath, chest tightness or wheezing-1 to 2 puffs every 4-6 hours May get Mucinex DM over-the-counter twice daily May use benzonatate/Tessalon every 8 hours for cough or may use over-the-counter Robitussin, Delsym or Dimetapp if cheaper Flonase nasal spray 1 to 2 spray in each nostril daily to help with sinus pressure nasal congestion and ear pressure Rest and fluids Follow-up if not improving or worsening

## 2020-09-28 ENCOUNTER — Encounter (HOSPITAL_COMMUNITY): Payer: Self-pay

## 2020-09-28 ENCOUNTER — Ambulatory Visit (HOSPITAL_COMMUNITY)
Admission: EM | Admit: 2020-09-28 | Discharge: 2020-09-28 | Disposition: A | Discharge status: Home / Self Care | Payer: Self-pay | Attending: Family Medicine | Admitting: Family Medicine

## 2020-09-28 ENCOUNTER — Other Ambulatory Visit: Payer: Self-pay

## 2020-09-28 DIAGNOSIS — A059 Bacterial foodborne intoxication, unspecified: Secondary | ICD-10-CM

## 2020-09-28 LAB — POCT URINALYSIS DIPSTICK, ED / UC
Glucose, UA: NEGATIVE mg/dL
Hgb urine dipstick: NEGATIVE
Leukocytes,Ua: NEGATIVE
Nitrite: NEGATIVE
Protein, ur: NEGATIVE mg/dL
Specific Gravity, Urine: >=1.03 (ref 1.005–1.030)
Urobilinogen, UA: 0.2 mg/dL (ref 0.0–1.0)
pH: 5.5 (ref 5.0–8.0)

## 2020-09-28 LAB — POC URINE PREG, ED: Preg Test, Ur: NEGATIVE

## 2020-09-28 NOTE — ED Triage Notes (Signed)
Pt reports vomiting and having diarrhea since last night. She states she might have food poisoning. She states she ate cook out yesterday and started to feel sick. Pt states she has lower back and mid abdomen pain.

## 2020-09-28 NOTE — ED Provider Notes (Signed)
[facility]    CSN: [id number] Arrival date & time: 09/28/20  1549      History   Chief Complaint Chief Complaint  Patient presents with  . Fatigue  . Vomiting  . Abdominal Pain    HPI Gloria Proctor is a 25 y.o. female.   Patient is a 25 year old female presents today with complaints of nausea, vomiting and diarrhea.  This started early this morning around 3 AM.  Last night ate cookout prior to this starting.  Most of the symptoms have resolved and she is feeling slightly better.  Was able to hold down fluids.  Having mild low back pain and mild abdominal cramping.  No dysuria, hematuria urine frequency.  No fevers, chills, vaginal discharge, itching or irritation. Patient's last menstrual period was 09/23/2020 (approximate).      Past Medical History:  Diagnosis Date  . Anxiety   . Depression   . Migraines     Patient Active Problem List   Diagnosis Date Noted  . Breast abscess 11/23/2017  . Major depressive disorder, recurrent severe without psychotic features (HCC) 01/27/2016  . Cannabis use disorder, moderate, dependence (HCC) 01/27/2016  . Tobacco use disorder 01/27/2016  . Amenorrhea 07/19/2015  . Hidradenitis suppurativa 12/21/2014  . Migraine without aura and without status migrainosus, not intractable 08/15/2013  . Obesity 07/02/2008  . RHINITIS, ALLERGIC 10/25/2006    Past Surgical History:  Procedure Laterality Date  . MULTIPLE EXTRACTIONS WITH ALVEOLOPLASTY N/A 06/04/2015   Procedure: REMOVAL OF WISDOM TEETH;  Surgeon: Ocie Doyne, DDS;  Location: [facility];  Service: Oral Surgery;  Laterality: N/A;  . TONSILLECTOMY    . TONSILLECTOMY AND ADENOIDECTOMY N/A 08/16/2013   Procedure: TONSILLECTOMY AND ADENOIDECTOMY;  Surgeon: Flo Shanks, MD;  Location: [facility];  Service: ENT;  Laterality: N/A;    OB History   No obstetric history on file.      Home Medications    Prior to Admission medications   Medication Sig Start Date End Date  Taking? Authorizing Provider  fluticasone (FLONASE) 50 MCG/ACT nasal spray Place 1-2 sprays into both nostrils daily for 7 days. 05/16/20 05/23/20  Wieters, Junius Creamer, PA-C    Family History Family History  Problem Relation Age of Onset  . Migraines Mother        Started having migraines during adulthood, after child birth  . Heart attack Paternal Grandfather     Social History Social History   Tobacco Use  . Smoking status: Former Smoker    Types: Cigarettes    Start date: 03/28/2014  . Smokeless tobacco: Never Used  . Tobacco comment: quit 3 wks ago  Vaping Use  . Vaping Use: Never used  Substance Use Topics  . Alcohol use: No  . Drug use: No     Allergies   Caffeine and Chocolate   Review of Systems Review of Systems   Physical Exam Triage Vital Signs ED Triage Vitals  Enc Vitals Group     BP 09/28/20 1625 123/61     Pulse Rate 09/28/20 1625 73     Resp 09/28/20 1625 18     Temp --      Temp src --      SpO2 09/28/20 1625 99 %     Weight --      Height --      Head Circumference --      Peak Flow --      Pain Score 09/28/20 1624 6  Pain Loc --      Pain Edu? --      Excl. in GC? --    No data found.  Updated Vital Signs BP 123/61 (BP Location: Right Arm)   Pulse 73   Resp 18   LMP 09/23/2020 (Approximate)   SpO2 99%   Visual Acuity Right Eye Distance:   Left Eye Distance:   Bilateral Distance:    Right Eye Near:   Left Eye Near:    Bilateral Near:     Physical Exam Vitals and nursing note reviewed.  Constitutional:      General: She is not in acute distress.    Appearance: Normal appearance. She is not ill-appearing, toxic-appearing or diaphoretic.  HENT:     Head: Normocephalic.  Eyes:     Conjunctiva/sclera: Conjunctivae normal.  Pulmonary:     Effort: Pulmonary effort is normal.  Musculoskeletal:        General: Normal range of motion.     Cervical back: Normal range of motion.  Skin:    General: Skin is warm and dry.      Findings: No rash.  Neurological:     Mental Status: She is alert.  Psychiatric:        Mood and Affect: Mood normal.      UC Treatments / Results  Labs (all labs ordered are listed, but only abnormal results are displayed) Labs Reviewed  POCT URINALYSIS DIPSTICK, ED / UC - Abnormal; Notable for the following components:      Result Value   Bilirubin Urine SMALL (*)    Ketones, ur TRACE (*)    All other components within normal limits  POC URINE PREG, ED    EKG   Radiology No results found.  Procedures Procedures (including critical care time)  Medications Ordered in UC Medications - No data to display  Initial Impression / Assessment and Plan / UC Course  I have reviewed the triage vital signs and the nursing notes.  Pertinent labs & imaging results that were available during my care of the patient were reviewed by me and considered in my medical decision making (see chart for details).     Food poisoning This is most likely the cause of her symptoms.  Symptoms started shortly after eating cookout.  Symptoms have mostly resolved.  No other concerning signs or symptoms.  Recommended rest, hydrate.  Urine without any acute findings Follow up as needed for continued or worsening symptoms  Final Clinical Impressions(s) / UC Diagnoses   Final diagnoses:  Food poisoning     Discharge Instructions     Your urine was normal This is most likely food poisoning.  Rest, hydrate.  Follow up as needed for continued or worsening symptoms     ED Prescriptions    None     PDMP not reviewed this encounter.   Janace Aris, NP 09/28/20 1851

## 2020-09-28 NOTE — Discharge Instructions (Addendum)
Your urine was normal This is most likely food poisoning.  Rest, hydrate.  Follow up as needed for continued or worsening symptoms

## 2020-11-17 ENCOUNTER — Emergency Department (HOSPITAL_COMMUNITY)
Admission: EM | Admit: 2020-11-17 | Discharge: 2020-11-18 | Disposition: A | Discharge status: Home / Self Care | Payer: No Typology Code available for payment source | Attending: Emergency Medicine | Admitting: Emergency Medicine

## 2020-11-17 ENCOUNTER — Other Ambulatory Visit: Payer: Self-pay

## 2020-11-17 ENCOUNTER — Encounter (HOSPITAL_COMMUNITY): Payer: Self-pay | Admitting: *Deleted

## 2020-11-17 DIAGNOSIS — Z87891 Personal history of nicotine dependence: Secondary | ICD-10-CM | POA: Insufficient documentation

## 2020-11-17 DIAGNOSIS — S300XXA Contusion of lower back and pelvis, initial encounter: Secondary | ICD-10-CM

## 2020-11-17 DIAGNOSIS — Y92410 Unspecified street and highway as the place of occurrence of the external cause: Secondary | ICD-10-CM | POA: Diagnosis not present

## 2020-11-17 DIAGNOSIS — M545 Low back pain, unspecified: Secondary | ICD-10-CM | POA: Diagnosis present
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
## 2020-11-18 ENCOUNTER — Emergency Department (HOSPITAL_COMMUNITY): Payer: No Typology Code available for payment source

## 2020-11-18 ENCOUNTER — Other Ambulatory Visit: Payer: Self-pay

## 2020-11-18 MED ORDER — HYDROCODONE-ACETAMINOPHEN 5-325 MG PO TABS: 1.0000 | ORAL_TABLET | Freq: Four times a day (QID) | ORAL | 0 refills | Status: DC | PRN

## 2020-11-18 MED ORDER — HYDROCODONE-ACETAMINOPHEN 5-325 MG PO TABS
2.0000 | ORAL_TABLET | Freq: Once | ORAL | Status: AC
  Administered 2020-11-18: 2 via ORAL
  Filled 2020-11-18: qty 2
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
## 2020-11-18 NOTE — Discharge Instructions (Signed)
Take ibuprofen 600 mg every 6 hours as needed for pain.  Begin taking hydrocodone as prescribed as needed for pain not relieved with ibuprofen.  Rest, and perform activity as tolerated.  Follow-up with primary doctor if symptoms are not improving in the next few days.

## 2020-11-20 ENCOUNTER — Encounter (HOSPITAL_COMMUNITY): Payer: Self-pay | Admitting: Emergency Medicine

## 2020-11-20 ENCOUNTER — Ambulatory Visit (HOSPITAL_COMMUNITY)
Admission: EM | Admit: 2020-11-20 | Discharge: 2020-11-20 | Disposition: A | Discharge status: Home / Self Care | Payer: Medicaid Other

## 2020-11-20 ENCOUNTER — Other Ambulatory Visit: Payer: Self-pay

## 2020-11-20 ENCOUNTER — Emergency Department (HOSPITAL_COMMUNITY)
Admission: EM | Admit: 2020-11-20 | Discharge: 2020-11-20 | Disposition: A | Discharge status: Home / Self Care | Payer: Medicaid Other | Attending: Emergency Medicine | Admitting: Emergency Medicine

## 2020-11-20 DIAGNOSIS — Y92410 Unspecified street and highway as the place of occurrence of the external cause: Secondary | ICD-10-CM | POA: Insufficient documentation

## 2020-11-20 DIAGNOSIS — M545 Low back pain, unspecified: Secondary | ICD-10-CM | POA: Insufficient documentation

## 2020-11-20 DIAGNOSIS — M542 Cervicalgia: Secondary | ICD-10-CM | POA: Insufficient documentation

## 2020-11-20 DIAGNOSIS — R1084 Generalized abdominal pain: Secondary | ICD-10-CM | POA: Insufficient documentation

## 2020-11-20 DIAGNOSIS — Z5321 Procedure and treatment not carried out due to patient leaving prior to being seen by health care provider: Secondary | ICD-10-CM | POA: Insufficient documentation

## 2020-11-20 DIAGNOSIS — R1031 Right lower quadrant pain: Secondary | ICD-10-CM

## 2020-11-20 NOTE — ED Notes (Signed)
Patient is being discharged from the Urgent Care and sent to the Emergency Department via POV . Per Ignacia Bayley, PA, patient is in need of higher level of care due to severe abdominal pain. Patient is aware and verbalizes understanding of plan of care.  Vitals:   11/20/20 1151  BP: 130/88  Pulse: 73  Resp: 18  Temp: 98.4 F (36.9 C)  SpO2: 100%
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
## 2020-11-20 NOTE — Discharge Instructions (Addendum)
-  Head straight to [facility] ED for further evaluation and management of abdominal pain. You will likely need a CT scan of your abdomen to make sure you're not having internal bleeding.  -If you experience worsening of symptoms on the way, like new dizziness, shortness of breath, chest pain, worsening of abdominal pain. -If you do not go to the emergency room, you could die.  Internal bleeding can be fatal and this can happen quickly.  I am not providing a work note today as the emergency room will do this for you.
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
## 2020-11-21 ENCOUNTER — Emergency Department (HOSPITAL_COMMUNITY): Payer: No Typology Code available for payment source

## 2020-11-21 ENCOUNTER — Other Ambulatory Visit: Payer: Self-pay

## 2020-11-21 ENCOUNTER — Emergency Department (HOSPITAL_COMMUNITY)
Admission: EM | Admit: 2020-11-21 | Discharge: 2020-11-21 | Disposition: A | Discharge status: Home / Self Care | Payer: No Typology Code available for payment source | Attending: Emergency Medicine | Admitting: Emergency Medicine

## 2020-11-21 ENCOUNTER — Encounter (HOSPITAL_COMMUNITY): Payer: Self-pay | Admitting: Emergency Medicine

## 2020-11-21 DIAGNOSIS — Y92410 Unspecified street and highway as the place of occurrence of the external cause: Secondary | ICD-10-CM | POA: Diagnosis not present

## 2020-11-21 DIAGNOSIS — R1031 Right lower quadrant pain: Secondary | ICD-10-CM | POA: Insufficient documentation

## 2020-11-21 DIAGNOSIS — Z87891 Personal history of nicotine dependence: Secondary | ICD-10-CM | POA: Diagnosis not present

## 2020-11-21 DIAGNOSIS — R1030 Lower abdominal pain, unspecified: Secondary | ICD-10-CM

## 2020-11-21 DIAGNOSIS — M545 Low back pain, unspecified: Secondary | ICD-10-CM | POA: Diagnosis not present

## 2020-11-21 DIAGNOSIS — M542 Cervicalgia: Secondary | ICD-10-CM

## 2020-11-21 LAB — CBC WITH DIFFERENTIAL/PLATELET
Abs Immature Granulocytes: 0.01 10*3/uL (ref 0.00–0.07)
Basophils Absolute: 0 10*3/uL (ref 0.0–0.1)
Basophils Relative: 1 %
Eosinophils Absolute: 0.1 10*3/uL (ref 0.0–0.5)
Eosinophils Relative: 2 %
HCT: 41.6 % (ref 36.0–46.0)
Hemoglobin: 13.5 g/dL (ref 12.0–15.0)
Immature Granulocytes: 0 %
Lymphocytes Relative: 33 %
Lymphs Abs: 1.3 10*3/uL (ref 0.7–4.0)
MCH: 30.3 pg (ref 26.0–34.0)
MCHC: 32.5 g/dL (ref 30.0–36.0)
MCV: 93.5 fL (ref 80.0–100.0)
Monocytes Absolute: 0.5 10*3/uL (ref 0.1–1.0)
Monocytes Relative: 11 %
Neutro Abs: 2.1 10*3/uL (ref 1.7–7.7)
Neutrophils Relative %: 53 %
Platelets: 336 10*3/uL (ref 150–400)
RBC: 4.45 MIL/uL (ref 3.87–5.11)
RDW: 13 % (ref 11.5–15.5)
WBC: 4 10*3/uL (ref 4.0–10.5)
nRBC: 0 % (ref 0.0–0.2)

## 2020-11-21 LAB — URINALYSIS, ROUTINE W REFLEX MICROSCOPIC
Bacteria, UA: NONE SEEN
Bilirubin Urine: NEGATIVE
Glucose, UA: NEGATIVE mg/dL
Hgb urine dipstick: NEGATIVE
Ketones, ur: NEGATIVE mg/dL
Leukocytes,Ua: NEGATIVE
Nitrite: NEGATIVE
Protein, ur: NEGATIVE mg/dL
Specific Gravity, Urine: >1.046 — ABNORMAL HIGH (ref 1.005–1.030)
pH: 5 (ref 5.0–8.0)

## 2020-11-21 LAB — COMPREHENSIVE METABOLIC PANEL
ALT: 37 U/L (ref 0–44)
AST: 22 U/L (ref 15–41)
Albumin: 3.5 g/dL (ref 3.5–5.0)
Alkaline Phosphatase: 50 U/L (ref 38–126)
Anion gap: 5 (ref 5–15)
BUN: 15 mg/dL (ref 6–20)
CO2: 27 mmol/L (ref 22–32)
Calcium: 8.5 mg/dL — ABNORMAL LOW (ref 8.9–10.3)
Chloride: 105 mmol/L (ref 98–111)
Creatinine, Ser: 0.73 mg/dL (ref 0.44–1.00)
GFR, Estimated: >60 mL/min (ref >60)
Glucose, Bld: 94 mg/dL (ref 70–99)
Potassium: 4.4 mmol/L (ref 3.5–5.1)
Sodium: 137 mmol/L (ref 135–145)
Total Bilirubin: 0.8 mg/dL (ref 0.3–1.2)
Total Protein: 6.9 g/dL (ref 6.5–8.1)

## 2020-11-21 LAB — I-STAT BETA HCG BLOOD, ED (MC, WL, AP ONLY): I-stat hCG, quantitative: <5 m[IU]/mL (ref <5)

## 2020-11-21 LAB — LIPASE, BLOOD: Lipase: 29 U/L (ref 11–51)

## 2020-11-21 MED ORDER — IOHEXOL 300 MG/ML  SOLN
100.0000 mL | Freq: Once | INTRAMUSCULAR | Status: AC | PRN
  Administered 2020-11-21: 100 mL via INTRAVENOUS

## 2020-11-21 MED ORDER — METHOCARBAMOL 500 MG PO TABS: 500.0000 mg | ORAL_TABLET | Freq: Two times a day (BID) | ORAL | 0 refills | Status: DC

## 2020-11-21 MED ORDER — MORPHINE SULFATE (PF) 4 MG/ML IV SOLN
4.0000 mg | Freq: Once | INTRAVENOUS | Status: AC
  Administered 2020-11-21: 4 mg via INTRAVENOUS
  Filled 2020-11-21: qty 1

## 2020-11-21 MED ORDER — ONDANSETRON HCL 4 MG/2ML IJ SOLN
4.0000 mg | Freq: Once | INTRAMUSCULAR | Status: AC
Start: 2020-11-21 — End: 2020-11-21
  Administered 2020-11-21: 4 mg via INTRAVENOUS
  Filled 2020-11-21: qty 2

## 2020-11-21 NOTE — Discharge Instructions (Signed)
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
## 2021-01-22 ENCOUNTER — Encounter (HOSPITAL_COMMUNITY): Payer: Self-pay

## 2021-01-22 ENCOUNTER — Ambulatory Visit (HOSPITAL_COMMUNITY)
Admission: EM | Admit: 2021-01-22 | Discharge: 2021-01-22 | Disposition: A | Discharge status: Home / Self Care | Payer: Medicaid Other | Attending: Emergency Medicine | Admitting: Emergency Medicine

## 2021-01-22 DIAGNOSIS — R112 Nausea with vomiting, unspecified: Secondary | ICD-10-CM | POA: Insufficient documentation

## 2021-01-22 DIAGNOSIS — Z87891 Personal history of nicotine dependence: Secondary | ICD-10-CM | POA: Insufficient documentation

## 2021-01-22 DIAGNOSIS — U071 COVID-19: Secondary | ICD-10-CM | POA: Insufficient documentation

## 2021-01-22 DIAGNOSIS — R309 Painful micturition, unspecified: Secondary | ICD-10-CM | POA: Insufficient documentation

## 2021-01-22 DIAGNOSIS — M545 Low back pain, unspecified: Secondary | ICD-10-CM | POA: Insufficient documentation

## 2021-01-22 LAB — SARS CORONAVIRUS 2 (TAT 6-24 HRS): SARS Coronavirus 2: POSITIVE — AB

## 2021-01-22 LAB — POCT URINALYSIS DIPSTICK, ED / UC
Bilirubin Urine: NEGATIVE
Glucose, UA: NEGATIVE mg/dL
Hgb urine dipstick: NEGATIVE
Leukocytes,Ua: NEGATIVE
Nitrite: NEGATIVE
Protein, ur: NEGATIVE mg/dL
Specific Gravity, Urine: 1.025 (ref 1.005–1.030)
Urobilinogen, UA: 0.2 mg/dL (ref 0.0–1.0)
pH: 6.5 (ref 5.0–8.0)

## 2021-01-22 LAB — POC URINE PREG, ED: Preg Test, Ur: NEGATIVE

## 2021-01-22 MED ORDER — DICYCLOMINE HCL 20 MG PO TABS: 20.0000 mg | ORAL_TABLET | Freq: Three times a day (TID) | ORAL | 0 refills | Status: DC

## 2021-01-22 MED ORDER — ONDANSETRON 4 MG PO TBDP: 4.0000 mg | ORAL_TABLET | Freq: Three times a day (TID) | ORAL | 0 refills | Status: AC | PRN

## 2021-01-22 MED ORDER — KETOROLAC TROMETHAMINE 30 MG/ML IJ SOLN
30.0000 mg | Freq: Once | INTRAMUSCULAR | Status: AC
  Administered 2021-01-22: 30 mg via INTRAMUSCULAR

## 2021-01-22 MED ORDER — KETOROLAC TROMETHAMINE 30 MG/ML IJ SOLN
INTRAMUSCULAR | Status: AC
  Filled 2021-01-22: qty 1

## 2021-01-22 NOTE — ED Triage Notes (Signed)
Pt in with c/o body aches and back pain, vomiting, and chills that started Wednesday  Pt took muscle relaxer and ibuprofen with no releif

## 2021-01-22 NOTE — Discharge Instructions (Addendum)
Take Zofran and Bentyl as directed.

## 2021-01-22 NOTE — ED Provider Notes (Signed)
[facility]  ____________________________________________  Time seen: Approximately 11:41 AM  I have reviewed the triage vital signs and the nursing notes.   HISTORY  Chief Complaint Generalized Body Aches, Chills, and Emesis   Historian Patient     HPI Gloria Proctor is a 25 y.o. female presents to the emergency department with body aches, low back pain, vomiting, chills and pain with urination.  Patient denies increased urinary frequency, hematuria or history of urinary tract infections.  Patient denies history of nephrolithiasis.  Denies sick contacts in her home with similar symptoms.  No associated rhinorrhea, nasal congestion or nonproductive cough.  No changes in vaginal discharge.  Patient denies possibility of pregnancy.   Past Medical History:  Diagnosis Date  . Anxiety   . Depression   . Migraines      Immunizations up to date:  Yes.     Past Medical History:  Diagnosis Date  . Anxiety   . Depression   . Migraines     Patient Active Problem List   Diagnosis Date Noted  . Breast abscess 11/23/2017  . Major depressive disorder, recurrent severe without psychotic features (HCC) 01/27/2016  . Cannabis use disorder, moderate, dependence (HCC) 01/27/2016  . Tobacco use disorder 01/27/2016  . Amenorrhea 07/19/2015  . Hidradenitis suppurativa 12/21/2014  . Migraine without aura and without status migrainosus, not intractable 08/15/2013  . Obesity 07/02/2008  . RHINITIS, ALLERGIC 10/25/2006    Past Surgical History:  Procedure Laterality Date  . MULTIPLE EXTRACTIONS WITH ALVEOLOPLASTY N/A 06/04/2015   Procedure: REMOVAL OF WISDOM TEETH;  Surgeon: Ocie Doyne, DDS;  Location: [facility];  Service: Oral Surgery;  Laterality: N/A;  . TONSILLECTOMY    . TONSILLECTOMY AND ADENOIDECTOMY N/A 08/16/2013   Procedure: TONSILLECTOMY AND ADENOIDECTOMY;  Surgeon: Flo Shanks, MD;  Location: [facility];  Service: ENT;  Laterality: N/A;    Prior to Admission  medications   Medication Sig Start Date End Date Taking? Authorizing Provider  dicyclomine (BENTYL) 20 MG tablet Take 1 tablet (20 mg total) by mouth 3 (three) times daily before meals. 01/22/21  Yes Pia Mau M, PA-C  ondansetron (ZOFRAN-ODT) 4 MG disintegrating tablet Take 1 tablet (4 mg total) by mouth every 8 (eight) hours as needed for up to 5 days for nausea or vomiting. 01/22/21 01/27/21 Yes Pia Mau M, PA-C  fluticasone [facility]) 50 MCG/ACT nasal spray Place 1-2 sprays into both nostrils daily for 7 days. 05/16/20 05/23/20  Wieters, Hallie C, PA-C  HYDROcodone-acetaminophen (NORCO) 5-325 MG tablet Take 1-2 tablets by mouth every 6 (six) hours as needed. 11/18/20   Geoffery Lyons, MD  methocarbamol (ROBAXIN) 500 MG tablet Take 1 tablet (500 mg total) by mouth 2 (two) times daily. 11/21/20   Dartha Lodge, PA-C    Allergies Caffeine and Chocolate  Family History  Problem Relation Age of Onset  . Migraines Mother        Started having migraines during adulthood, after child birth  . Heart attack Paternal Grandfather     Social History Social History   Tobacco Use  . Smoking status: Former Smoker    Types: Cigarettes    Start date: 03/28/2014  . Smokeless tobacco: Never Used  . Tobacco comment: quit 3 wks ago  Vaping Use  . Vaping Use: Never used  Substance Use Topics  . Alcohol use: No  . Drug use: No     Review of Systems  Constitutional: No fever/chills Eyes:  No discharge ENT: No upper respiratory  complaints. Respiratory: no cough. No SOB/ use of accessory muscles to breath Gastrointestinal: Patient has nausea and vomiting.  Musculoskeletal: Patient has low back pain.  Skin: Negative for rash, abrasions, lacerations, ecchymosis.    ____________________________________________   PHYSICAL EXAM:  VITAL SIGNS: ED Triage Vitals  Enc Vitals Group     BP 01/22/21 1121 130/85     Pulse Rate 01/22/21 1120 96     Resp 01/22/21 1120 19     Temp 01/22/21 1120 99.4  F (37.4 C)     Temp Source 01/22/21 1120 Oral     SpO2 01/22/21 1120 100 %     Weight --      Height --      Head Circumference --      Peak Flow --      Pain Score 01/22/21 1119 10     Pain Loc --      Pain Edu? --      Excl. in GC? --      Constitutional: Alert and oriented. Well appearing and in no acute distress. Eyes: Conjunctivae are normal. PERRL. EOMI. Head: Atraumatic. ENT:      Nose: No congestion/rhinnorhea.      Mouth/Throat: Mucous membranes are moist.  Neck: No stridor.  No cervical spine tenderness to palpation. Cardiovascular: Normal rate, regular rhythm. Normal S1 and S2.  Good peripheral circulation. Respiratory: Normal respiratory effort without tachypnea or retractions. Lungs CTAB. Good air entry to the bases with no decreased or absent breath sounds Gastrointestinal: Bowel sounds x 4 quadrants. Soft and nontender to palpation. No guarding or rigidity. No distention. Musculoskeletal: Full range of motion to all extremities. No obvious deformities noted Neurologic:  Normal for age. No gross focal neurologic deficits are appreciated.  Skin:  Skin is warm, dry and intact. No rash noted. Psychiatric: Mood and affect are normal for age. Speech and behavior are normal.   ____________________________________________   LABS (all labs ordered are listed, but only abnormal results are displayed)  Labs Reviewed  POCT URINALYSIS DIPSTICK, ED / UC - Abnormal; Notable for the following components:      Result Value   Ketones, ur TRACE (*)    All other components within normal limits  SARS CORONAVIRUS 2 (TAT 6-24 HRS)  POC URINE PREG, ED   ____________________________________________  EKG   ____________________________________________  RADIOLOGY   No results found.  ____________________________________________    PROCEDURES  Procedure(s) performed:     Procedures     Medications  ketorolac (TORADOL) 30 MG/ML injection 30 mg (30 mg  Intramuscular Given 01/22/21 1229)     ____________________________________________   INITIAL IMPRESSION / ASSESSMENT AND PLAN / ED COURSE  Pertinent labs & imaging results that were available during my care of the patient were reviewed by me and considered in my medical decision making (see chart for details).      Assessment and Plan: Back pain: Nausea:  25 year old female presents to the emergency department with nausea, vomiting, body aches and low back pain.  Vital signs are reassuring at triage.  On physical exam, patient was alert, active and nontoxic-appearing.  Urinalysis showed trace ketones but no other findings concerning for UTI.  Urine pregnancy test was negative.  Sendoff COVID-19 is in process at this time.  Patient was given Toradol in the urgent care for body aches and was discharged with Bentyl and Zofran.  Return precautions were given to return with new or worsening symptoms.  All patient questions were answered.  ____________________________________________  FINAL CLINICAL IMPRESSION(S) / ED DIAGNOSES  Final diagnoses:  Non-intractable vomiting with nausea, unspecified vomiting type      NEW MEDICATIONS STARTED DURING THIS VISIT:  ED Discharge Orders         Ordered    ondansetron (ZOFRAN-ODT) 4 MG disintegrating tablet  Every 8 hours PRN        01/22/21 1204    dicyclomine (BENTYL) 20 MG tablet  3 times daily before meals        01/22/21 1204              This chart was dictated using voice recognition software/Dragon. Despite best efforts to proofread, errors can occur which can change the meaning. Any change was purely unintentional.     Orvil Feil, PA-C 01/22/21 1253

## 2021-01-23 ENCOUNTER — Other Ambulatory Visit: Payer: Self-pay | Admitting: Physician Assistant

## 2021-01-23 ENCOUNTER — Telehealth: Payer: Self-pay | Admitting: Physician Assistant

## 2021-01-23 MED ORDER — NIRMATRELVIR/RITONAVIR (PAXLOVID)TABLET: 3.0000 | ORAL_TABLET | Freq: Two times a day (BID) | ORAL | 0 refills | Status: DC

## 2021-01-23 NOTE — Telephone Encounter (Signed)
Called to discuss with patient about COVID-19 symptoms and the use of one of the available treatments for those with mild to moderate Covid symptoms and at a high risk of hospitalization.  Pt appears to qualify for outpatient treatment due to co-morbid conditions and/or a member of an at-risk group in accordance with the FDA Emergency Use Authorization.    Symptom onset: unknown Vaccinated: unknown Booster? unknown Immunocompromised? no Qualifiers: SVI, BMI NIH Criteria: 2  Unable to reach pt - no VM set up.  Will sent mychart  Cline Crock

## 2021-01-23 NOTE — Progress Notes (Signed)
Outpatient Oral COVID Treatment Note  I connected with Gloria Proctor on 01/23/2021/9:51 AM by telephone and verified that I am speaking with the correct person using two identifiers.  I discussed the limitations, risks, security, and privacy concerns of performing an evaluation and management service by telephone and the availability of in person appointments. I also discussed with the patient that there may be a patient responsible charge related to this service. The patient expressed understanding and agreed to proceed.  Patient location: home Provider location: office  Diagnosis: COVID-19 infection  Purpose of visit: Discussion of potential use of Molnupiravir or Paxlovid, a new treatment for mild to moderate COVID-19 viral infection in non-hospitalized patients.   Subjective: Patient is a 25 y.o. female who has been diagnosed with COVID 19 viral infection.  Their symptoms began on 5/27 with cough and chills. Had one vaccine    Past Medical History:  Diagnosis Date  . Anxiety   . Depression   . Migraines     Allergies  Allergen Reactions  . Caffeine Other (See Comments)    Patient has migraines   . Chocolate Other (See Comments)    Reaction:migraines     Current Outpatient Medications:  .  dicyclomine (BENTYL) 20 MG tablet, Take 1 tablet (20 mg total) by mouth 3 (three) times daily before meals., Disp: 20 tablet, Rfl: 0 .  fluticasone (FLONASE) 50 MCG/ACT nasal spray, Place 1-2 sprays into both nostrils daily for 7 days., Disp: 1 g, Rfl: 0 .  methocarbamol (ROBAXIN) 500 MG tablet, Take 1 tablet (500 mg total) by mouth 2 (two) times daily., Disp: 20 tablet, Rfl: 0 .  ondansetron (ZOFRAN-ODT) 4 MG disintegrating tablet, Take 1 tablet (4 mg total) by mouth every 8 (eight) hours as needed for up to 5 days for nausea or vomiting., Disp: 20 tablet, Rfl: 0  Objective: Patient sounds stable on phone.  They are in no apparent distress.  Breathing is non labored.  Mood and behavior are  normal.  Laboratory Data:  Recent Results (from the past 2160 hour(s))  Comprehensive metabolic panel     Status: Abnormal   Collection Time: 11/21/20 12:05 PM  Result Value Ref Range   Sodium 137 135 - 145 mmol/L   Potassium 4.4 3.5 - 5.1 mmol/L   Chloride 105 98 - 111 mmol/L   CO2 27 22 - 32 mmol/L   Glucose, Bld 94 70 - 99 mg/dL    Comment: Glucose reference range applies only to samples taken after fasting for at least 8 hours.   BUN 15 6 - 20 mg/dL   Creatinine, Ser 1.610.73 0.44 - 1.00 mg/dL   Calcium 8.5 (L) 8.9 - 10.3 mg/dL   Total Protein 6.9 6.5 - 8.1 g/dL   Albumin 3.5 3.5 - 5.0 g/dL   AST 22 15 - 41 U/L   ALT 37 0 - 44 U/L   Alkaline Phosphatase 50 38 - 126 U/L   Total Bilirubin 0.8 0.3 - 1.2 mg/dL   GFR, Estimated >09>60 >60>60 mL/min    Comment: (NOTE) Calculated using the CKD-EPI Creatinine Equation (2021)    Anion gap 5 5 - 15    Comment: Performed at [facility], [address] [place], [place] [id number]  Lipase, blood     Status: None   Collection Time: 11/21/20 12:05 PM  Result Value Ref Range   Lipase 29 11 - 51 U/L    Comment: Performed at [facility], [address]  [place], [place] [address]  CBC with Differential     Status: None   Collection Time: 11/21/20 12:05 PM  Result Value Ref Range   WBC 4.0 4.0 - 10.5 K/uL   RBC 4.45 3.87 - 5.11 MIL/uL   Hemoglobin 13.5 12.0 - 15.0 g/dL   HCT 40.1 02.7 - 25.3 %   MCV 93.5 80.0 - 100.0 fL   MCH 30.3 26.0 - 34.0 pg   MCHC 32.5 30.0 - 36.0 g/dL   RDW 66.4 40.3 - 47.4 %   Platelets 336 150 - 400 K/uL   nRBC 0.0 0.0 - 0.2 %   Neutrophils Relative % 53 %   Neutro Abs 2.1 1.7 - 7.7 K/uL   Lymphocytes Relative 33 %   Lymphs Abs 1.3 0.7 - 4.0 K/uL   Monocytes Relative 11 %   Monocytes Absolute 0.5 0.1 - 1.0 K/uL   Eosinophils Relative 2 %   Eosinophils Absolute 0.1 0.0 - 0.5 K/uL   Basophils Relative 1 %   Basophils Absolute 0.0 0.0 - 0.1 K/uL   Immature Granulocytes 0 %    Abs Immature Granulocytes 0.01 0.00 - 0.07 K/uL    Comment: Performed at [facility], [address], [place], [place] [address]  Urinalysis, Routine w reflex microscopic     Status: Abnormal   Collection Time: 11/21/20 12:05 PM  Result Value Ref Range   Color, Urine YELLOW YELLOW   APPearance CLEAR CLEAR   Specific Gravity, Urine >1.046 (H) 1.005 - 1.030   pH 5.0 5.0 - 8.0   Glucose, UA NEGATIVE NEGATIVE mg/dL   Hgb urine dipstick NEGATIVE NEGATIVE   Bilirubin Urine NEGATIVE NEGATIVE   Ketones, ur NEGATIVE NEGATIVE mg/dL   Protein, ur NEGATIVE NEGATIVE mg/dL   Nitrite NEGATIVE NEGATIVE   Leukocytes,Ua NEGATIVE NEGATIVE   RBC / HPF 0-5 0 - 5 RBC/hpf   WBC, UA 0-5 0 - 5 WBC/hpf   Bacteria, UA NONE SEEN NONE SEEN   Squamous Epithelial / LPF 0-5 0 - 5   Mucus PRESENT     Comment: Performed at [facility], [address] [place], [place] [address]  I-Stat beta hCG blood, ED     Status: None   Collection Time: 11/21/20 12:43 PM  Result Value Ref Range   I-stat hCG, quantitative <5.0 <5 mIU/mL   Comment 3            Comment:   GEST. AGE      CONC.  (mIU/mL)   <=1 WEEK        5 - 50     2 WEEKS       50 - 500     3 WEEKS       100 - 10,000     4 WEEKS     1,000 - 30,000        FEMALE AND NON-PREGNANT FEMALE:     LESS THAN 5 mIU/mL   SARS CORONAVIRUS 2 (TAT 6-24 HRS) Nasopharyngeal Nasopharyngeal Swab     Status: Abnormal   Collection Time: 01/22/21 11:39 AM   Specimen: Nasopharyngeal Swab  Result Value Ref Range   SARS Coronavirus 2 POSITIVE (A) NEGATIVE    Comment: (NOTE) SARS-CoV-2 target nucleic acids are DETECTED.  The SARS-CoV-2 RNA is generally detectable in upper and lower respiratory specimens during the acute phase of infection. Positive results are indicative of the presence of SARS-CoV-2 RNA. Clinical correlation with patient history and other diagnostic information is  necessary to determine patient infection status.  Positive results do not rule out bacterial infection or co-infection with other viruses.  The expected result is Negative.  Fact Sheet for Patients: HairSlick.no  Fact Sheet for Healthcare Providers: quierodirigir.com  This test is not yet approved or cleared by the [place] FDA and  has been authorized for detection and/or diagnosis of SARS-CoV-2 by FDA under an Emergency Use Authorization (EUA). This EUA will remain  in effect (meaning this test can be used) for the duration of the COVID-19 declaration under Section 564(b)(1) of the Act, 21 U. S.C. section 360bbb-3(b)(1), unless the authorization is terminated or revoked sooner.   Performed at [facility], [address], [place], [place] [address]   POC Urinalysis dipstick     Status: Abnormal   Collection Time: 01/22/21 11:47 AM  Result Value Ref Range   Glucose, UA NEGATIVE NEGATIVE mg/dL   Bilirubin Urine NEGATIVE NEGATIVE   Ketones, ur TRACE (A) NEGATIVE mg/dL   Specific Gravity, Urine 1.025 1.005 - 1.030   Hgb urine dipstick NEGATIVE NEGATIVE   pH 6.5 5.0 - 8.0   Protein, ur NEGATIVE NEGATIVE mg/dL   Urobilinogen, UA 0.2 0.0 - 1.0 mg/dL   Nitrite NEGATIVE NEGATIVE   Leukocytes,Ua NEGATIVE NEGATIVE    Comment: Biochemical Testing Only. Please order routine urinalysis from main lab if confirmatory testing is needed.  POC urine pregnancy     Status: None   Collection Time: 01/22/21 11:51 AM  Result Value Ref Range   Preg Test, Ur NEGATIVE NEGATIVE    Comment:        THE SENSITIVITY OF THIS METHODOLOGY IS >24 mIU/mL      Assessment: 25 y.o. female with mild/moderate COVID 19 viral infection diagnosed on 5/28 at high risk for progression to severe COVID 19.  Plan:  This patient is a 25 y.o. female that meets the following criteria for Emergency Use Authorization of: Paxlovid 1. Age >12 yr AND > 40 kg 2. SARS-COV-2 positive test 3. Symptom onset < 5  days 4. Mild-to-moderate COVID disease with high risk for severe progression to hospitalization or death  I have spoken and communicated the following to the patient or parent/caregiver regarding: 1. Paxlovid is an unapproved drug that is authorized for use under an Emergency Use Authorization.  2. There are no adequate, approved, available products for the treatment of COVID-19 in adults who have mild-to-moderate COVID-19 and are at high risk for progressing to severe COVID-19, including hospitalization or death. 3. Other therapeutics are currently authorized. For additional information on all products authorized for treatment or prevention of COVID-19, please see https://www.graham-miller.com/.  4. There are benefits and risks of taking this treatment as outlined in the "Fact Sheet for Patients and Caregivers."  5. "Fact Sheet for Patients and Caregivers" was reviewed with patient. A hard copy will be provided to patient from pharmacy prior to the patient receiving treatment. 6. Patients should continue to self-isolate and use infection control measures (e.g., wear mask, isolate, social distance, avoid sharing personal items, clean and disinfect "high touch" surfaces, and frequent handwashing) according to CDC guidelines.  7. The patient or parent/caregiver has the option to accept or refuse treatment. 8. Patient medication history was reviewed for potential drug interactions:Interaction with home meds: flonase 9. Patient's GFR was calculated to be >60, and they were therefore prescribed Normal dose (GFR>60) - nirmatrelvir 150mg  tab (2 tablet) by mouth twice daily AND ritonavir 100mg  tab (1 tablet) by mouth twice daily  After reviewing above information with the patient, the patient agrees to receive Paxlovid.  Follow up instructions:    . Take prescription BID x 5 days as directed . Reach out to  pharmacist for counseling on medication if desired . For concerns regarding further COVID symptoms please follow up with your PCP or urgent care . For urgent or life-threatening issues, seek care at your local emergency department  The patient was provided an opportunity to ask questions, and all were answered. The patient agreed with the plan and demonstrated an understanding of the instructions.   Script sent to [facility] and opted to pick up RX.  The patient was advised to call their PCP or seek an in-person evaluation if the symptoms worsen or if the condition fails to improve as anticipated.   I provided 10 minutes of non face-to-face telephone visit time during this encounter, and > 50% was spent counseling as documented under my assessment & plan.  Cline Crock, PA-C 01/23/2021 /9:51 AM

## 2021-01-29 ENCOUNTER — Ambulatory Visit (HOSPITAL_COMMUNITY)
Admission: EM | Admit: 2021-01-29 | Discharge: 2021-01-29 | Disposition: A | Discharge status: Home / Self Care | Payer: Self-pay | Attending: Student | Admitting: Student

## 2021-01-29 DIAGNOSIS — Z1152 Encounter for screening for COVID-19: Secondary | ICD-10-CM | POA: Insufficient documentation

## 2021-01-29 LAB — SARS CORONAVIRUS 2 (TAT 6-24 HRS): SARS Coronavirus 2: POSITIVE — AB

## 2021-01-29 NOTE — ED Triage Notes (Signed)
Pt presents today for covid test, stating "my employer requires a repeat test in 10 days". No active symptoms.

## 2021-04-07 IMAGING — CT CT L SPINE W/O CM
3 of 5 series · 13 of 33 positions shown, 15 images · non-contrast
Comparison: Unavailable none available.

CLINICAL DATA: Initial evaluation for acute low back pain, trauma.

EXAM:
CT LUMBAR SPINE WITHOUT CONTRAST
TECHNIQUE: Multidetector CT imaging of the lumbar spine was performed without
intravenous contrast administration. Multiplanar CT image
reconstructions were also generated.

[Series 5: l spine soft · axial · 0.29mm/px · z∈[+1017,+1237]mm · 7 of 132 slices shown, 9 images]
[im 11/132  soft-tissue]
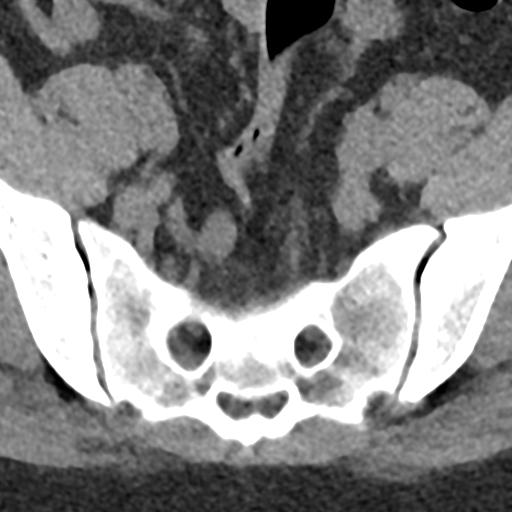
[im 11/132  bone]
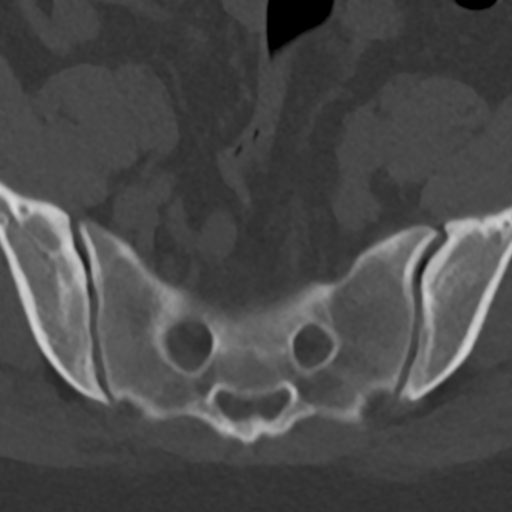
[im 31/132  bone]
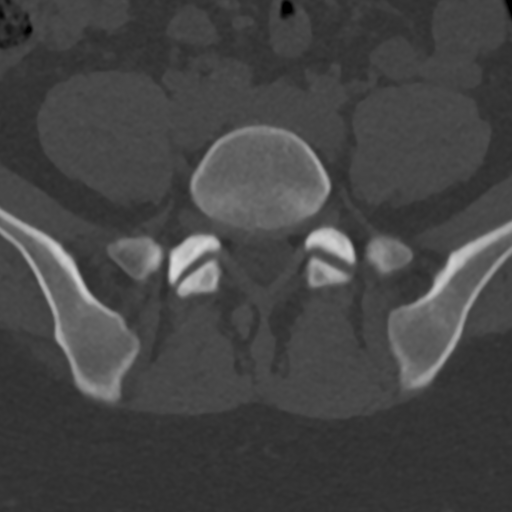
[im 51/132  bone]
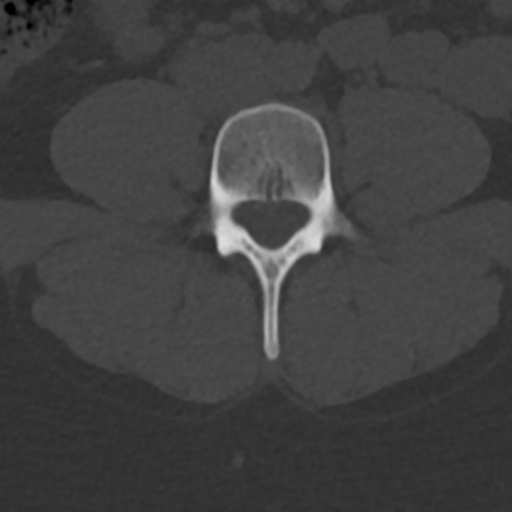
[im 71/132  bone]
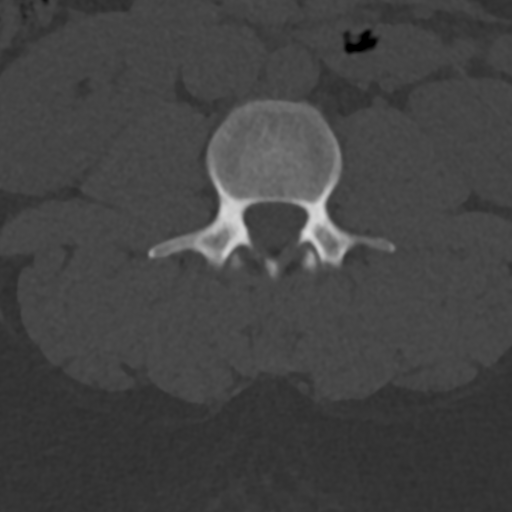
[im 81/132  soft-tissue]
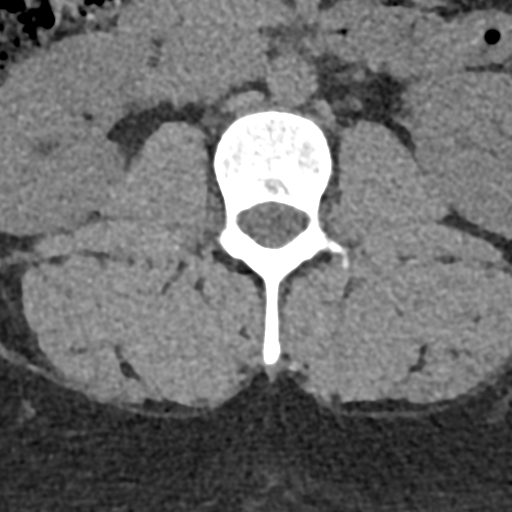
[im 81/132  bone]
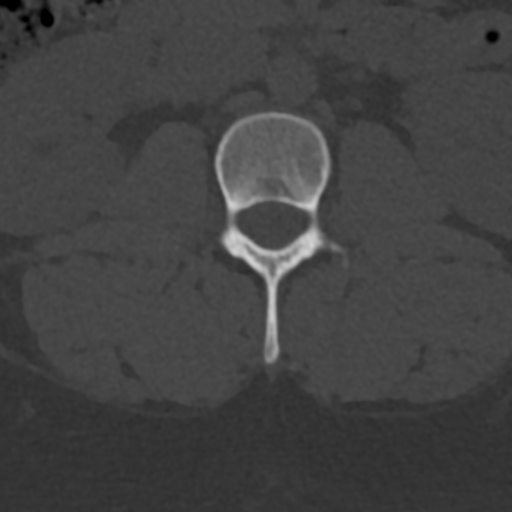
[im 101/132  bone]
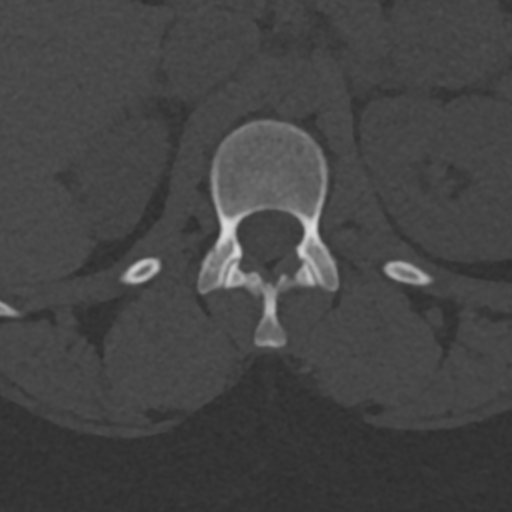
[im 121/132  bone]
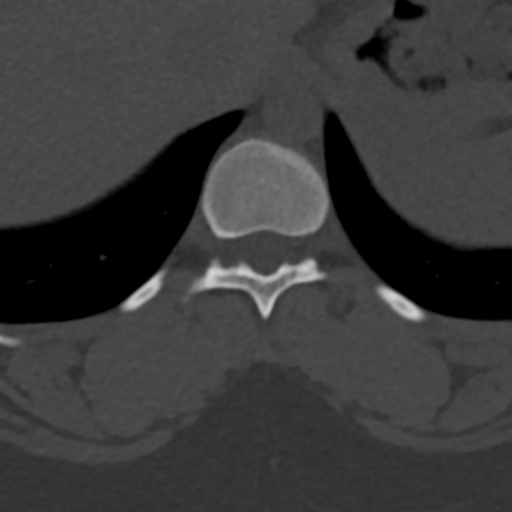

[Series 7: sag bone · sagittal · 0.30mm/px · 5 of 73 slices shown]
[im 13/73  bone]
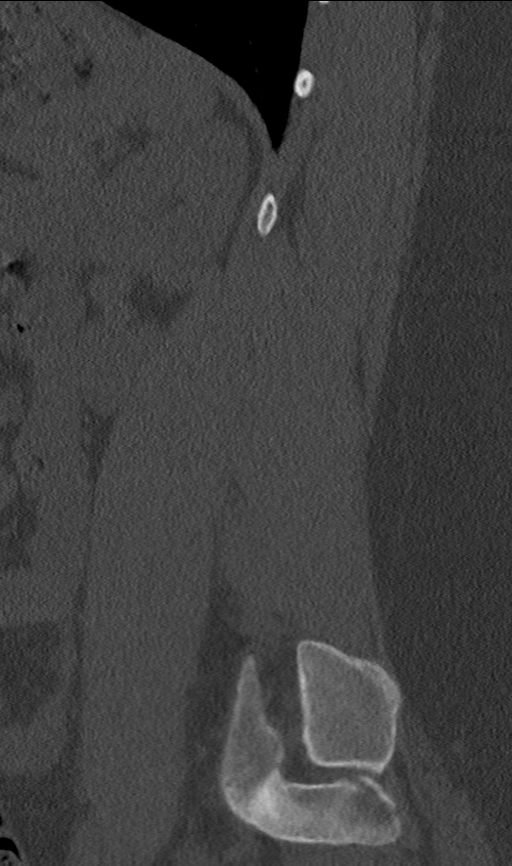
[im 25/73  bone]
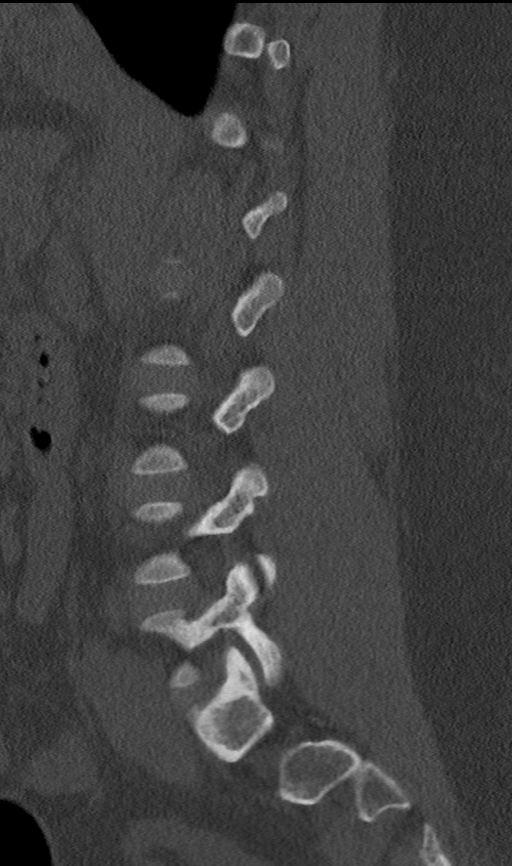
[im 37/73  bone]
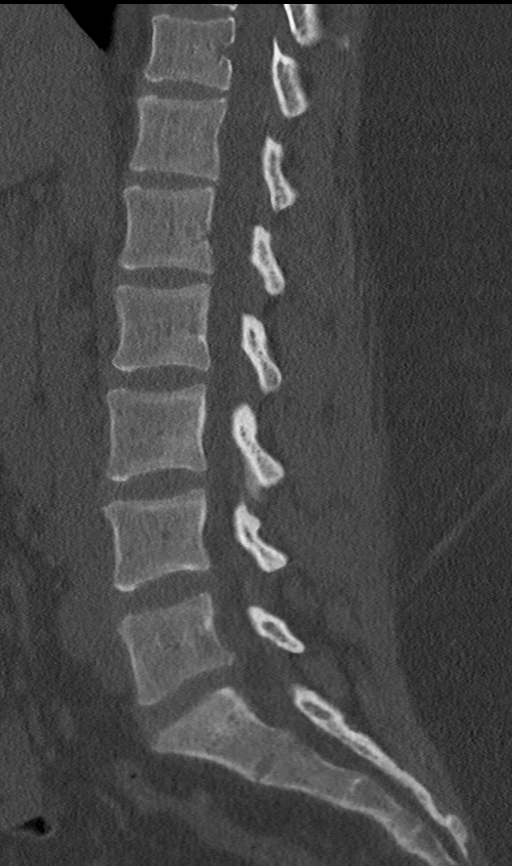
[im 49/73  bone]
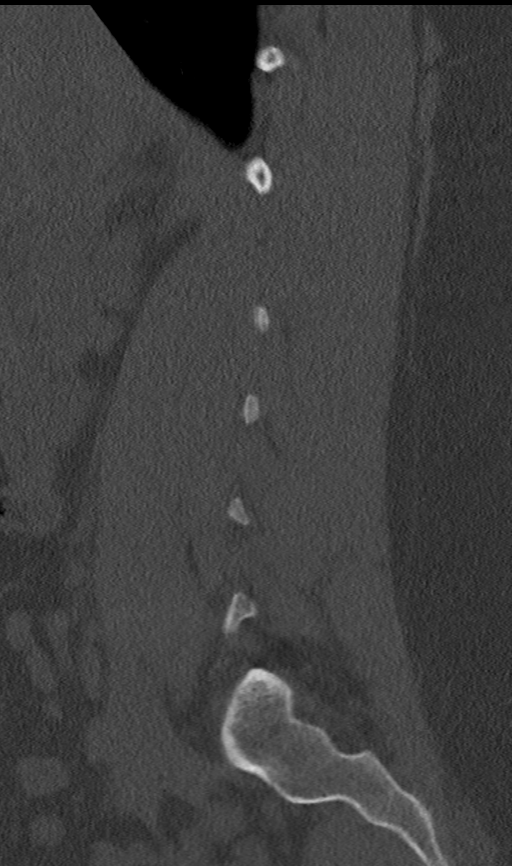
[im 61/73  bone]
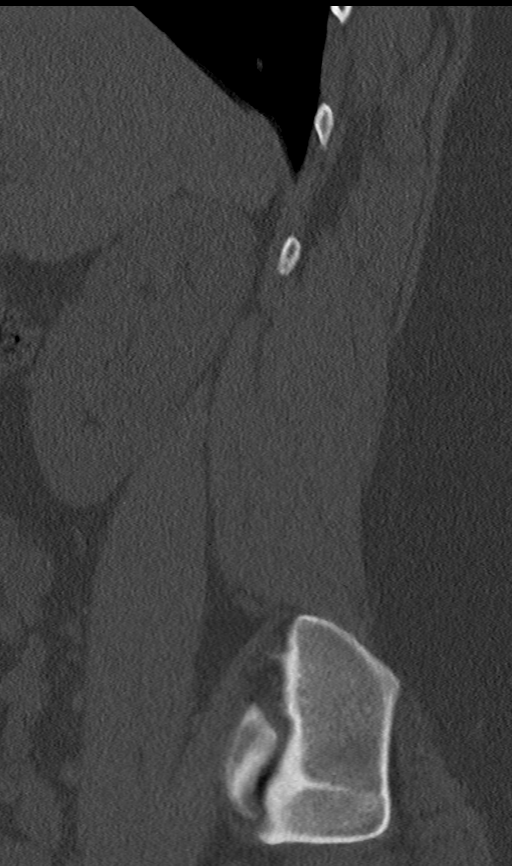

[Series 8: cor bone · coronal · 0.28mm/px · 1 of 77 slices shown]
[im 39/77  bone]
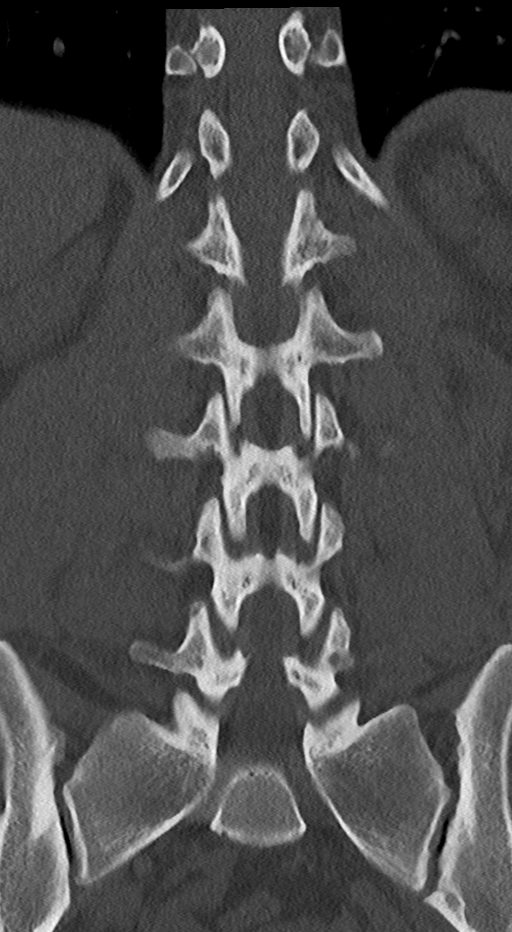

[13 of 33 positions shown; findings below may reference images not displayed]

FINDINGS: Segmentation: Standard. Lowest well-formed disc space labeled the
L5-S1 level.

Alignment: Trace retrolisthesis of L5 on S1. Alignment otherwise
normal with preservation of the normal lumbar lordosis. No traumatic
malalignment.

Vertebrae: Vertebral body height maintained without acute or chronic
fracture. Visualized sacrum and pelvis intact. SI joints
approximated symmetric. No discrete or worrisome osseous lesions.

Paraspinal and other soft tissues: Paraspinous soft tissues
demonstrate no acute finding. Visualized visceral structures and
intra-abdominal contents demonstrate no acute finding.

Disc levels: No significant disc pathology seen within the lumbar
spine. No significant spinal stenosis. Foramina appear grossly
patent. No overt neural impingement.
IMPRESSION: No acute traumatic injury within the lumbar spine. No significant
stenosis or overt neural impingement.

## 2021-09-12 ENCOUNTER — Ambulatory Visit (HOSPITAL_COMMUNITY)
Admission: EM | Admit: 2021-09-12 | Discharge: 2021-09-12 | Disposition: A | Discharge status: Home / Self Care | Payer: Self-pay | Attending: Urgent Care | Admitting: Urgent Care

## 2021-09-12 ENCOUNTER — Other Ambulatory Visit: Payer: Self-pay

## 2021-09-12 ENCOUNTER — Encounter (HOSPITAL_COMMUNITY): Payer: Self-pay | Admitting: Emergency Medicine

## 2021-09-12 DIAGNOSIS — M545 Low back pain, unspecified: Secondary | ICD-10-CM

## 2021-09-12 DIAGNOSIS — K582 Mixed irritable bowel syndrome: Secondary | ICD-10-CM

## 2021-09-12 MED ORDER — DICYCLOMINE HCL 20 MG PO TABS: ORAL_TABLET | ORAL | 0 refills | Status: DC

## 2021-09-12 MED ORDER — TIZANIDINE HCL 4 MG PO TABS: 4.0000 mg | ORAL_TABLET | Freq: Four times a day (QID) | ORAL | 0 refills | Status: DC | PRN

## 2021-09-12 NOTE — ED Triage Notes (Signed)
PT reports muscle spasm in left lower back, started last night.
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
## 2021-09-12 NOTE — Discharge Instructions (Addendum)
Use epsom salt baths and moist heat to apply directly to affected area Start using the tizanidine on an as needed basis for back pain or muscle spasms.  This medication may make you drowsy so use with caution. Follow up with PCP or establish care with GI to address any recurrent bowel concerns. Will restart dicyclomine, take on an as needed basis for your bowels. It may make you sleepy.

## 2022-01-21 ENCOUNTER — Encounter (HOSPITAL_COMMUNITY): Payer: Self-pay

## 2022-01-21 ENCOUNTER — Ambulatory Visit (HOSPITAL_COMMUNITY)
Admission: RE | Admit: 2022-01-21 | Discharge: 2022-01-21 | Disposition: A | Discharge status: Home / Self Care | Payer: Medicaid Other | Source: Ambulatory Visit | Attending: Internal Medicine | Admitting: Internal Medicine

## 2022-01-21 VITALS — BP 124/81 | HR 70 | Resp 16 | Ht 63.5 in | Wt 259.9 lb

## 2022-01-21 DIAGNOSIS — Z202 Contact with and (suspected) exposure to infections with a predominantly sexual mode of transmission: Secondary | ICD-10-CM

## 2022-01-21 DIAGNOSIS — Z113 Encounter for screening for infections with a predominantly sexual mode of transmission: Secondary | ICD-10-CM | POA: Insufficient documentation

## 2022-01-21 LAB — HIV ANTIBODY (ROUTINE TESTING W REFLEX): HIV Screen 4th Generation wRfx: NONREACTIVE

## 2022-01-21 NOTE — ED Provider Notes (Signed)
[facility]    CSN: [id number] Arrival date & time: 01/21/22  1015      History   Chief Complaint Chief Complaint  Patient presents with   SEXUALLY TRANSMITTED DISEASE    HPI Gloria Speedotiyana D Proctor is a 26 y.o. female.   Patient presents urgent care for routine STI testing.  Her girlfriend recently tested positive for herpes infection.  She denies rash to her genital area at this time.  Denies urinary frequency, dysuria, and urgency.  Denies vaginal discharge, odor, and itch.  She does not have any nausea, vomiting, dizziness, headache, or abdominal pain at this time.  Requesting HIV and RPR testing as well today.  No other aggravating or relieving factors identified.    Past Medical History:  Diagnosis Date   Anxiety    Depression    Migraines     Patient Active Problem List   Diagnosis Date Noted   Breast abscess 11/23/2017   Major depressive disorder, recurrent severe without psychotic features (HCC) 01/27/2016   Cannabis use disorder, moderate, dependence (HCC) 01/27/2016   Tobacco use disorder 01/27/2016   Amenorrhea 07/19/2015   Hidradenitis suppurativa 12/21/2014   Migraine without aura and without status migrainosus, not intractable 08/15/2013   Obesity 07/02/2008   RHINITIS, ALLERGIC 10/25/2006    Past Surgical History:  Procedure Laterality Date   MULTIPLE EXTRACTIONS WITH ALVEOLOPLASTY N/A 06/04/2015   Procedure: REMOVAL OF WISDOM TEETH;  Surgeon: Ocie DoyneScott Jensen, DDS;  Location: [facility];  Service: Oral Surgery;  Laterality: N/A;   TONSILLECTOMY     TONSILLECTOMY AND ADENOIDECTOMY N/A 08/16/2013   Procedure: TONSILLECTOMY AND ADENOIDECTOMY;  Surgeon: Flo ShanksKarol Wolicki, MD;  Location: [facility];  Service: ENT;  Laterality: N/A;    OB History   No obstetric history on file.      Home Medications    Prior to Admission medications   Medication Sig Start Date End Date Taking? Authorizing Provider  dicyclomine (BENTYL) 20 MG tablet Take one tab TID PRN  bowel cramp/spasm 09/12/21   Crain, Whitney L, PA  fluticasone (FLONASE) 50 MCG/ACT nasal spray Place 1-2 sprays into both nostrils daily for 7 days. 05/16/20 05/23/20  Wieters, Hallie C, PA-C  tiZANidine (ZANAFLEX) 4 MG tablet Take 1 tablet (4 mg total) by mouth every 6 (six) hours as needed for muscle spasms. 09/12/21   Maretta Beesrain, Whitney L, PA    Family History Family History  Problem Relation Age of Onset   Migraines Mother        Started having migraines during adulthood, after child birth   Heart attack Paternal Grandfather     Social History Social History   Tobacco Use   Smoking status: Former    Types: Cigarettes    Start date: 03/28/2014   Smokeless tobacco: Never   Tobacco comments:    quit 3 wks ago  Vaping Use   Vaping Use: Never used  Substance Use Topics   Alcohol use: No   Drug use: No     Allergies   Caffeine and Chocolate   Review of Systems Review of Systems   Physical Exam Triage Vital Signs ED Triage Vitals  Enc Vitals Group     BP 01/21/22 1034 124/81     Pulse Rate 01/21/22 1034 70     Resp 01/21/22 1034 16     Temp --      Temp src --      SpO2 01/21/22 1034 100 %     Weight 01/21/22 1033 259  lb 14.8 oz (117.9 kg)     Height 01/21/22 1033 5' 3.5" (1.613 m)     Head Circumference --      Peak Flow --      Pain Score 01/21/22 1033 0     Pain Loc --      Pain Edu? --      Excl. in GC? --    No data found.  Updated Vital Signs BP 124/81 (BP Location: Left Arm)   Pulse 70   Resp 16   Ht 5' 3.5" (1.613 m)   Wt 259 lb 14.8 oz (117.9 kg)   LMP 01/02/2022 (Exact Date)   SpO2 100%   BMI 45.32 kg/m   Visual Acuity Right Eye Distance:   Left Eye Distance:   Bilateral Distance:    Right Eye Near:   Left Eye Near:    Bilateral Near:     Physical Exam Vitals and nursing note reviewed.  Constitutional:      General: She is not in acute distress.    Appearance: Normal appearance. She is well-developed. She is obese.  HENT:     Head:  Normocephalic and atraumatic.     Nose: Nose normal.     Mouth/Throat:     Mouth: Mucous membranes are moist.     Pharynx: No posterior oropharyngeal erythema.  Eyes:     Conjunctiva/sclera: Conjunctivae normal.  Cardiovascular:     Rate and Rhythm: Normal rate and regular rhythm.     Heart sounds: No murmur heard.   No friction rub. No gallop.  Pulmonary:     Effort: Pulmonary effort is normal. No respiratory distress.     Breath sounds: Normal breath sounds. No wheezing, rhonchi or rales.  Abdominal:     Palpations: Abdomen is soft.     Tenderness: There is no abdominal tenderness.  Musculoskeletal:        General: No swelling. Normal range of motion.     Cervical back: Normal range of motion and neck supple.  Skin:    General: Skin is warm and dry.     Capillary Refill: Capillary refill takes less than 2 seconds.     Findings: No erythema or rash.  Neurological:     General: No focal deficit present.     Mental Status: She is alert. Mental status is at baseline.     Motor: No weakness.     Gait: Gait normal.  Psychiatric:        Mood and Affect: Mood normal.     UC Treatments / Results  Labs (all labs ordered are listed, but only abnormal results are displayed) Labs Reviewed  HIV ANTIBODY (ROUTINE TESTING W REFLEX)  RPR  CERVICOVAGINAL ANCILLARY ONLY    EKG   Radiology No results found.  Procedures Procedures (including critical care time)  Medications Ordered in UC Medications - No data to display  Initial Impression / Assessment and Plan / UC Course  I have reviewed the triage vital signs and the nursing notes.  Pertinent labs & imaging results that were available during my care of the patient were reviewed by me and considered in my medical decision making (see chart for details).  Patient presents to urgent care for STI testing after her girlfriend tested positive for genital herpes.  She denies symptoms.  STI testing pending and will return in the  next 2 to 3 days.  Plan to treat based on STI testing results.  Patient has MyChart and will see  results there as well.  HIV and RPR testing pending.  Physical exam is stable and patient is asymptomatic at this time.  Counseled patient regarding appropriate use of medications and potential side effects for all medications recommended or prescribed today. Discussed red flag signs and symptoms of worsening condition,when to call the PCP office, return to urgent care, and when to seek higher level of care. Patient verbalizes understanding and agreement with plan. All questions answered. Patient discharged in stable condition.   Final Clinical Impressions(s) / UC Diagnoses   Final diagnoses:  Encounter for screening examination for sexually transmitted disease     Discharge Instructions      You were seen in urgent care today for STI screening.  Your results will be back in the next 2 to 3 days.  You will be able to see these results on MyChart.  If you do not receive a phone call from [place], this means that all of your results were negative and normal.  We will call you if any of your results are positive or change our treatment plan today.  Follow-up at urgent care as needed.     ED Prescriptions   None    PDMP not reviewed this encounter.   Carlisle Beers, [place] 01/21/22 1120

## 2022-01-21 NOTE — Discharge Instructions (Signed)
You were seen in urgent care today for STI screening.  Your results will be back in the next 2 to 3 days.  You will be able to see these results on MyChart.  If you do not receive a phone call from [place], this means that all of your results were negative and normal.  We will call you if any of your results are positive or change our treatment plan today.  Follow-up at urgent care as needed.

## 2022-01-21 NOTE — ED Triage Notes (Signed)
Pt presents to UC for routine STD screening. Denies any current symptoms.

## 2022-01-22 LAB — RPR: RPR Ser Ql: NONREACTIVE

## 2022-01-24 LAB — CERVICOVAGINAL ANCILLARY ONLY
Bacterial Vaginitis (gardnerella): POSITIVE — AB
Candida Glabrata: NEGATIVE
Candida Vaginitis: POSITIVE — AB
Chlamydia: NEGATIVE
Comment: NEGATIVE
Comment: NEGATIVE
Comment: NEGATIVE
Comment: NEGATIVE
Comment: NEGATIVE
Comment: NORMAL
Neisseria Gonorrhea: NEGATIVE
Trichomonas: POSITIVE — AB

## 2022-01-25 ENCOUNTER — Telehealth (HOSPITAL_COMMUNITY): Payer: Self-pay | Admitting: Emergency Medicine

## 2022-01-25 MED ORDER — FLUCONAZOLE 150 MG PO TABS: 150.0000 mg | ORAL_TABLET | Freq: Once | ORAL | 0 refills | Status: AC

## 2022-01-25 MED ORDER — METRONIDAZOLE 500 MG PO TABS: 500.0000 mg | ORAL_TABLET | Freq: Two times a day (BID) | ORAL | 0 refills | Status: DC

## 2022-07-17 ENCOUNTER — Ambulatory Visit (HOSPITAL_COMMUNITY)
Admission: EM | Admit: 2022-07-17 | Discharge: 2022-07-17 | Disposition: A | Discharge status: Home / Self Care | Payer: Self-pay | Attending: Physician Assistant | Admitting: Physician Assistant

## 2022-07-17 DIAGNOSIS — J069 Acute upper respiratory infection, unspecified: Secondary | ICD-10-CM | POA: Insufficient documentation

## 2022-07-17 DIAGNOSIS — Z1152 Encounter for screening for COVID-19: Secondary | ICD-10-CM | POA: Insufficient documentation

## 2022-07-17 DIAGNOSIS — R110 Nausea: Secondary | ICD-10-CM | POA: Insufficient documentation

## 2022-07-17 LAB — RESP PANEL BY RT-PCR (FLU A&B, COVID) ARPGX2
Influenza A by PCR: NEGATIVE
Influenza B by PCR: NEGATIVE
SARS Coronavirus 2 by RT PCR: NEGATIVE

## 2022-07-17 MED ORDER — ONDANSETRON 4 MG PO TBDP
ORAL_TABLET | ORAL | Status: AC
  Filled 2022-07-17: qty 1

## 2022-07-17 MED ORDER — ONDANSETRON 4 MG PO TBDP
4.0000 mg | ORAL_TABLET | Freq: Once | ORAL | Status: AC
  Administered 2022-07-17: 4 mg via ORAL

## 2022-07-17 MED ORDER — DM-GUAIFENESIN ER 30-600 MG PO TB12: 1.0000 | ORAL_TABLET | Freq: Two times a day (BID) | ORAL | 0 refills | Status: DC

## 2022-07-17 MED ORDER — ONDANSETRON HCL 4 MG PO TABS: 4.0000 mg | ORAL_TABLET | Freq: Three times a day (TID) | ORAL | 0 refills | Status: DC | PRN

## 2022-07-17 NOTE — ED Triage Notes (Signed)
Pt is here for exposure to covid pt has a headache , sore throat , fatigue , nasal congestion , and nausea x1day

## 2022-07-17 NOTE — ED Provider Notes (Signed)
[facility]    CSN: [id number] Arrival date & time: 07/17/22  0825      History   Chief Complaint Chief Complaint  Patient presents with   Fatigue   Nausea   Sore Throat   Headache   Nasal Congestion    HPI Gloria Proctor is a 26 y.o. female.   26 year old female presents with headache, nausea, congestion.  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  And also indicates that she has been exposed to her grandmother who recently tested positive for flu.  Patient indicates for the past 1 to 2 days she has been having upper respiratory congestion with sinus congestion, postnasal drip and rhinitis which is mainly clear.  Patient indicates she does have a history of having migraine headaches and over the past days she has been having some headache that is associated with nausea.  She relates that she has not been having any vomiting.  Patient also relates she is having some mild chest congestion and has producing production which has been clear.  Patient denies having fever, chills, but she does have some body aches and fatigue.  Patient indicates that she is tolerating fluids well.  She is mainly concerned wanting to make sure that she does not have COVID or flu.  She also desires to have medication for the nausea.   Sore Throat Associated symptoms include headaches.  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/27/2016   Cannabis use disorder, moderate, dependence (HCC) 01/27/2016   Tobacco use disorder 01/27/2016   Amenorrhea 07/19/2015   Hidradenitis suppurativa 12/21/2014   Migraine without aura and without status  migrainosus, not intractable 08/15/2013   Obesity 07/02/2008   RHINITIS, ALLERGIC 10/25/2006    Past Surgical History:  Procedure Laterality Date   MULTIPLE EXTRACTIONS WITH ALVEOLOPLASTY N/A 06/04/2015   Procedure: REMOVAL OF WISDOM TEETH;  Surgeon: Ocie Doyne, DDS;  Location: [facility];  Service: Oral Surgery;  Laterality: N/A;   TONSILLECTOMY     TONSILLECTOMY AND ADENOIDECTOMY N/A 08/16/2013   Procedure: TONSILLECTOMY AND ADENOIDECTOMY;  Surgeon: Flo Shanks, MD;  Location: [facility];  Service: ENT;  Laterality: N/A;    OB History   No obstetric history on file.      Home Medications    Prior to Admission medications   Medication Sig Start Date End Date Taking? Authorizing Provider  dextromethorphan-guaiFENesin (MUCINEX DM) 30-600 MG 12hr tablet Take 1 tablet by mouth 2 (two) times daily. 07/17/22  Yes Ellsworth Lennox, PA-C  ondansetron (ZOFRAN) 4 MG tablet Take 1 tablet (4 mg total) by mouth every 8 (eight) hours as needed for nausea or vomiting. 07/17/22  Yes Ellsworth Lennox, PA-C  dicyclomine (BENTYL) 20 MG tablet Take one tab TID PRN bowel cramp/spasm 09/12/21   Crain, Whitney L, PA  fluticasone (FLONASE) 50 MCG/ACT nasal spray Place 1-2 sprays into both nostrils daily for 7 days. 05/16/20 05/23/20  Wieters, Hallie C, PA-C  metroNIDAZOLE (FLAGYL) 500 MG tablet Take 1 tablet (500 mg total) by mouth 2 (two) times daily. 01/25/22   Lamptey, Britta Mccreedy, MD  tiZANidine (ZANAFLEX) 4 MG  tablet Take 1 tablet (4 mg total) by mouth every 6 (six) hours as needed for muscle spasms. 09/12/21   Maretta Bees, PA    Family History Family History  Problem Relation Age of Onset   Migraines Mother        Started having migraines during adulthood, after child birth   Heart attack Paternal Grandfather     Social History Social History   Tobacco Use   Smoking status: Former    Types: Cigarettes    Start date: 03/28/2014   Smokeless tobacco: Never   Tobacco comments:    quit 3 wks ago  Vaping Use    Vaping Use: Never used  Substance Use Topics   Alcohol use: No   Drug use: No     Allergies   Caffeine and Chocolate   Review of Systems Review of Systems  Constitutional:  Positive for fatigue.  HENT:  Positive for postnasal drip and sinus pressure.   Neurological:  Positive for headaches.     Physical Exam Triage Vital Signs ED Triage Vitals [07/17/22 0854]  Enc Vitals Group     BP 139/82     Pulse Rate 80     Resp      Temp 98 F (36.7 C)     Temp Source Oral     SpO2 97 %     Weight      Height      Head Circumference      Peak Flow      Pain Score      Pain Loc      Pain Edu?      Excl. in GC?    No data found.  Updated Vital Signs BP 139/82 (BP Location: Right Arm)   Pulse 80   Temp 98 F (36.7 C) (Oral)   LMP 07/11/2022   SpO2 97%   Visual Acuity Right Eye Distance:   Left Eye Distance:   Bilateral Distance:    Right Eye Near:   Left Eye Near:    Bilateral Near:     Physical Exam Constitutional:      Appearance: She is well-developed.  HENT:     Right Ear: Tympanic membrane and ear canal normal.     Left Ear: Tympanic membrane and ear canal normal.     Mouth/Throat:     Mouth: Mucous membranes are moist.     Pharynx: Oropharynx is clear.  Cardiovascular:     Rate and Rhythm: Normal rate and regular rhythm.     Heart sounds: Normal heart sounds.  Pulmonary:     Effort: Pulmonary effort is normal.     Breath sounds: Normal breath sounds and air entry. No wheezing, rhonchi or rales.  Lymphadenopathy:     Cervical: No cervical adenopathy.  Neurological:     Mental Status: She is alert.      UC Treatments / Results  Labs (all labs ordered are listed, but only abnormal results are displayed) Labs Reviewed  RESP PANEL BY RT-PCR (FLU A&B, COVID) [id number]    EKG   Radiology No results found.  Procedures Procedures (including critical care time)  Medications Ordered in UC Medications  ondansetron (ZOFRAN-ODT)  disintegrating tablet 4 mg (4 mg Oral Given 07/17/22 0933)    Initial Impression / Assessment and Plan / UC Course  I have reviewed the triage vital signs and the nursing notes.  Pertinent labs & imaging results that were available during my care of the patient were  reviewed by me and considered in my medical decision making (see chart for details).    Plan: 1.  The upper respiratory infection be treated with the following: A.  Mucinex DM every 12 hours for cough and congestion. 2.  The nausea will be treated with the following: A.  Zofran 4 mg every 6 hours as needed for nausea. 3.  Screen for COVID-19 will be treated with the following: A.  Treatment will be modified depending on the COVID and flu test results. 4.  Patient advised to follow-up PCP or return to urgent care if symptoms fail to improve. Final Clinical Impressions(s) / UC Diagnoses   Final diagnoses:  Viral upper respiratory tract infection  Nausea  Encounter for screening for COVID-19     Discharge Instructions      COVID and flu test will be completed in 48 hours.  If you do not hear from this office that indicates the test are negative.  Log onto MyChart to view the test results when they post in 48 hours. Advised take the Zofran every 6 hours on a regular basis to help control nausea. Advised to take the Mucinex DM every 12 hours to help control cough and congestion. Advised to follow-up PCP or return to urgent care if symptoms fail to improve.    ED Prescriptions     Medication Sig Dispense Auth. Provider   ondansetron (ZOFRAN) 4 MG tablet Take 1 tablet (4 mg total) by mouth every 8 (eight) hours as needed for nausea or vomiting. 20 tablet Ellsworth Lennox, PA-C   dextromethorphan-guaiFENesin [facility] DM) 30-600 MG 12hr tablet Take 1 tablet by mouth 2 (two) times daily. 20 tablet Ellsworth Lennox, PA-C      PDMP not reviewed this encounter.   Ellsworth Lennox, PA-C 07/17/22 [phone]

## 2022-07-17 NOTE — Discharge Instructions (Addendum)
COVID and flu test will be completed in 48 hours.  If you do not hear from this office that indicates the test are negative.  Log onto MyChart to view the test results when they post in 48 hours. Advised take the Zofran every 6 hours on a regular basis to help control nausea. Advised to take the Mucinex DM every 12 hours to help control cough and congestion. Advised to follow-up PCP or return to urgent care if symptoms fail to improve.

## 2022-08-30 ENCOUNTER — Encounter (HOSPITAL_COMMUNITY): Payer: Self-pay | Admitting: Emergency Medicine

## 2022-08-30 ENCOUNTER — Ambulatory Visit (HOSPITAL_COMMUNITY)
Admission: EM | Admit: 2022-08-30 | Discharge: 2022-08-30 | Disposition: A | Discharge status: Home / Self Care | Payer: Medicaid Other | Attending: Internal Medicine | Admitting: Internal Medicine

## 2022-08-30 DIAGNOSIS — R590 Localized enlarged lymph nodes: Secondary | ICD-10-CM | POA: Diagnosis not present

## 2022-08-30 DIAGNOSIS — Z1152 Encounter for screening for COVID-19: Secondary | ICD-10-CM | POA: Insufficient documentation

## 2022-08-30 DIAGNOSIS — H6121 Impacted cerumen, right ear: Secondary | ICD-10-CM | POA: Diagnosis not present

## 2022-08-30 DIAGNOSIS — J069 Acute upper respiratory infection, unspecified: Secondary | ICD-10-CM | POA: Diagnosis present

## 2022-08-30 NOTE — ED Triage Notes (Signed)
Pt c/o headache, sinus congestion, cough itchy throat that is now sore for a couple days. Denies taking any medications for symptoms.

## 2022-08-30 NOTE — ED Provider Notes (Signed)
[facility]   [id number] 08/30/22 Arrival Time: 8416  ASSESSMENT & PLAN:  1. Viral upper respiratory tract infection    -History and exam are consistent with a viral upper respiratory illness.  I have high suspicion for COVID.  She was COVID tested today, result pending.  We will call her if the result is positive.  Recommended over-the-counter medicines such as Mucinex, Robitussin, Delsym as needed for symptomatic relief.  Recommended vigorous p.o. hydration and hand hygiene.  All questions were answered she agrees to plan.  No orders of the defined types were placed in this encounter.    Discharge Instructions      Debrox - earwax removal kit - should be available at [facility] or [facility]. Should cost around $10 or so.  Your symptoms today are most likely being caused by a virus and should steadily improve in time it can take up to 7 to 10 days before you truly start to see a turnaround however things will get better    You can take Tylenol and/or Ibuprofen as needed for fever reduction and pain relief.   For cough: honey 1/2 to 1 teaspoon (you can dilute the honey in water or another fluid).  You can also use guaifenesin and dextromethorphan for cough. You can use a humidifier for chest congestion and cough.  If you don't have a humidifier, you can sit in the bathroom with the hot shower running.      For sore throat: try warm salt water gargles, cepacol lozenges, throat spray, warm tea or water with lemon/honey, popsicles or ice, or OTC cold relief medicine for throat discomfort.   For congestion: take a daily anti-histamine like Zyrtec, Claritin, and a oral decongestant, such as pseudoephedrine.  You can also use Flonase 1-2 sprays in each nostril daily.   It is important to stay hydrated: drink plenty of fluids (water, gatorade/powerade/pedialyte, juices, or teas) to keep your throat moisturized and help further relieve irritation/discomfort.         Reviewed  expectations re: course of current medical issues. Questions answered. Outlined signs and symptoms indicating need for more acute intervention. Patient verbalized understanding. After Visit Summary given.   SUBJECTIVE: Pleasant 27 year old female comes urgent care to be evaluated for 2 days of headache, sinus congestion, cough, and sore throat.  Her headache is located in her forehead and over her sinuses.  Her cough is mostly nonproductive , with occasional production of clear mucus.  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  She has not tried taking any over-the-counter medicines yet.  She last had a viral upper respiratory illness in November 2023 and was negative for COVID at that time.  She denies any fevers, chest pain, shortness of breath.  She has received her COVID-19 vaccinations.  Patient's last menstrual period was 08/28/2022. Past Surgical History:  Procedure Laterality Date   MULTIPLE EXTRACTIONS WITH ALVEOLOPLASTY N/A 06/04/2015   Procedure: REMOVAL OF WISDOM TEETH;  Surgeon: Diona Browner, DDS;  Location: [facility];  Service: Oral Surgery;  Laterality: N/A;   TONSILLECTOMY     TONSILLECTOMY AND ADENOIDECTOMY N/A 08/16/2013   Procedure: TONSILLECTOMY AND ADENOIDECTOMY;  Surgeon: Jodi Marble, MD;  Location: [facility];  Service: ENT;  Laterality: N/A;     OBJECTIVE:  Vitals:   08/30/22 0943  BP: (!) 143/92  Pulse: 66  Resp: 15  Temp: 98.7 F (37.1 C)  TempSrc: Oral  SpO2: 100%     Physical  Exam Vitals and nursing note reviewed.  Constitutional:      Appearance: She is not ill-appearing.  HENT:     Right Ear: There is impacted cerumen.     Left Ear: Tympanic membrane normal.     Nose: Congestion present.     Mouth/Throat:     Pharynx: Posterior oropharyngeal erythema present.  Cardiovascular:     Rate and Rhythm: Normal rate.     Heart sounds: Normal heart sounds.  Pulmonary:     Effort: Pulmonary effort is normal. No  respiratory distress.     Breath sounds: Normal breath sounds. No wheezing or rales.  Lymphadenopathy:     Cervical: Cervical adenopathy present.  Neurological:     General: No focal deficit present.     Mental Status: She is alert.  Psychiatric:        Mood and Affect: Mood normal.      Labs: Results for orders placed or performed during the hospital encounter of 07/17/22  Resp Panel by RT-PCR (Flu A&B, Covid) Anterior Nasal Swab   Specimen: Anterior Nasal Swab  Result Value Ref Range   SARS Coronavirus 2 by RT PCR NEGATIVE NEGATIVE   Influenza A by PCR NEGATIVE NEGATIVE   Influenza B by PCR NEGATIVE NEGATIVE   Labs Reviewed  SARS CORONAVIRUS 2 (TAT 6-24 HRS)    Imaging: No results found.   Allergies  Allergen Reactions   Caffeine Other (See Comments)    Patient has migraines    Chocolate Other (See Comments)    Reaction:migraines                                               Past Medical History:  Diagnosis Date   Anxiety    Depression    Migraines     Social History   Socioeconomic History   Marital status: Single    Spouse name: Not on file   Number of children: Not on file   Years of education: Not on file   Highest education level: Not on file  Occupational History   Not on file  Tobacco Use   Smoking status: Former    Types: Cigarettes    Start date: 03/28/2014   Smokeless tobacco: Never   Tobacco comments:    quit 3 wks ago  Vaping Use   Vaping Use: Never used  Substance and Sexual Activity   Alcohol use: No   Drug use: No   Sexual activity: Yes    Birth control/protection: Condom  Other Topics Concern   Not on file  Social History Narrative   Not on file   Social Determinants of Health   Financial Resource Strain: Not on file  Food Insecurity: Not on file  Transportation Needs: Not on file  Physical Activity: Not on file  Stress: Not on file  Social Connections: Not on file  Intimate Partner Violence: Not on file    Family  History  Problem Relation Age of Onset   Migraines Mother        Started having migraines during adulthood, after child birth   Heart attack Paternal Grandfather       Brendan Gadson, Dorian Pod, MD 08/30/22 1037

## 2022-08-30 NOTE — Discharge Instructions (Addendum)
Debrox - earwax removal kit - should be available at [facility]. Should cost around $10 or so.  Your symptoms today are most likely being caused by a virus and should steadily improve in time it can take up to 7 to 10 days before you truly start to see a turnaround however things will get better    You can take Tylenol and/or Ibuprofen as needed for fever reduction and pain relief.   For cough: honey 1/2 to 1 teaspoon (you can dilute the honey in water or another fluid).  You can also use guaifenesin and dextromethorphan for cough. You can use a humidifier for chest congestion and cough.  If you don't have a humidifier, you can sit in the bathroom with the hot shower running.      For sore throat: try warm salt water gargles, cepacol lozenges, throat spray, warm tea or water with lemon/honey, popsicles or ice, or OTC cold relief medicine for throat discomfort.   For congestion: take a daily anti-histamine like Zyrtec, Claritin, and a oral decongestant, such as pseudoephedrine.  You can also use Flonase 1-2 sprays in each nostril daily.   It is important to stay hydrated: drink plenty of fluids (water, gatorade/powerade/pedialyte, juices, or teas) to keep your throat moisturized and help further relieve irritation/discomfort.

## 2022-08-31 LAB — SARS CORONAVIRUS 2 (TAT 6-24 HRS): SARS Coronavirus 2: NEGATIVE

## 2022-10-25 ENCOUNTER — Other Ambulatory Visit: Payer: Self-pay | Admitting: Internal Medicine

## 2022-10-27 LAB — COMPLETE METABOLIC PANEL WITH GFR
AG Ratio: 1.3 (calc) (ref 1.0–2.5)
ALT: 29 U/L (ref 6–29)
AST: 22 U/L (ref 10–30)
Albumin: 3.8 g/dL (ref 3.6–5.1)
Alkaline phosphatase (APISO): 68 U/L (ref 31–125)
BUN: 9 mg/dL (ref 7–25)
CO2: 25 mmol/L (ref 20–32)
Calcium: 8.8 mg/dL (ref 8.6–10.2)
Chloride: 106 mmol/L (ref 98–110)
Creat: 0.84 mg/dL (ref 0.50–0.96)
Globulin: 2.9 g/dL (calc) (ref 1.9–3.7)
Glucose, Bld: 93 mg/dL (ref 65–99)
Potassium: 4.3 mmol/L (ref 3.5–5.3)
Sodium: 139 mmol/L (ref 135–146)
Total Bilirubin: 0.3 mg/dL (ref 0.2–1.2)
Total Protein: 6.7 g/dL (ref 6.1–8.1)
eGFR: 98 mL/min/{1.73_m2} (ref >=60)

## 2022-10-27 LAB — CBC
HCT: 39.6 % (ref 35.0–45.0)
Hemoglobin: 13 g/dL (ref 11.7–15.5)
MCH: 29.5 pg (ref 27.0–33.0)
MCHC: 32.8 g/dL (ref 32.0–36.0)
MCV: 90 fL (ref 80.0–100.0)
MPV: 8.6 fL (ref 7.5–12.5)
Platelets: 388 10*3/uL (ref 140–400)
RBC: 4.4 10*6/uL (ref 3.80–5.10)
RDW: 12.1 % (ref 11.0–15.0)
WBC: 5 10*3/uL (ref 3.8–10.8)

## 2022-10-27 LAB — LIPID PANEL
Cholesterol: 146 mg/dL (ref <200)
HDL: 45 mg/dL — ABNORMAL LOW (ref >=50)
LDL Cholesterol (Calc): 90 mg/dL (calc)
Non-HDL Cholesterol (Calc): 101 mg/dL (calc) (ref <130)
Total CHOL/HDL Ratio: 3.2 (calc) (ref <5.0)
Triglycerides: 39 mg/dL (ref <150)

## 2022-10-27 LAB — TSH: TSH: 0.64 mIU/L

## 2022-10-27 LAB — VITAMIN D 25 HYDROXY (VIT D DEFICIENCY, FRACTURES): Vit D, 25-Hydroxy: 22 ng/mL — ABNORMAL LOW (ref 30–100)

## 2023-01-02 ENCOUNTER — Encounter: Payer: Medicaid Other | Admitting: Advanced Practice Midwife

## 2023-06-10 ENCOUNTER — Encounter (HOSPITAL_BASED_OUTPATIENT_CLINIC_OR_DEPARTMENT_OTHER): Payer: Self-pay | Admitting: Emergency Medicine

## 2023-06-10 ENCOUNTER — Emergency Department (HOSPITAL_BASED_OUTPATIENT_CLINIC_OR_DEPARTMENT_OTHER): Payer: Medicaid Other

## 2023-06-10 ENCOUNTER — Emergency Department (HOSPITAL_BASED_OUTPATIENT_CLINIC_OR_DEPARTMENT_OTHER): Payer: Medicaid Other | Admitting: Radiology

## 2023-06-10 ENCOUNTER — Emergency Department (HOSPITAL_BASED_OUTPATIENT_CLINIC_OR_DEPARTMENT_OTHER)
Admission: EM | Admit: 2023-06-10 | Discharge: 2023-06-10 | Disposition: A | Discharge status: Home / Self Care | Payer: Medicaid Other | Attending: Emergency Medicine | Admitting: Emergency Medicine

## 2023-06-10 ENCOUNTER — Other Ambulatory Visit: Payer: Self-pay

## 2023-06-10 DIAGNOSIS — R0789 Other chest pain: Secondary | ICD-10-CM | POA: Insufficient documentation

## 2023-06-10 DIAGNOSIS — R1084 Generalized abdominal pain: Secondary | ICD-10-CM | POA: Insufficient documentation

## 2023-06-10 LAB — LIPASE, BLOOD: Lipase: 34 U/L (ref 11–51)

## 2023-06-10 LAB — COMPREHENSIVE METABOLIC PANEL
ALT: 24 U/L (ref 0–44)
AST: 17 U/L (ref 15–41)
Albumin: 3.8 g/dL (ref 3.5–5.0)
Alkaline Phosphatase: 57 U/L (ref 38–126)
Anion gap: 8 (ref 5–15)
BUN: 8 mg/dL (ref 6–20)
CO2: 26 mmol/L (ref 22–32)
Calcium: 8.6 mg/dL — ABNORMAL LOW (ref 8.9–10.3)
Chloride: 104 mmol/L (ref 98–111)
Creatinine, Ser: 0.81 mg/dL (ref 0.44–1.00)
GFR, Estimated: >60 mL/min (ref >60)
Glucose, Bld: 85 mg/dL (ref 70–99)
Potassium: 3.9 mmol/L (ref 3.5–5.1)
Sodium: 138 mmol/L (ref 135–145)
Total Bilirubin: 0.5 mg/dL (ref 0.3–1.2)
Total Protein: 6.8 g/dL (ref 6.5–8.1)

## 2023-06-10 LAB — URINALYSIS, ROUTINE W REFLEX MICROSCOPIC
Bilirubin Urine: NEGATIVE
Glucose, UA: NEGATIVE mg/dL
Hgb urine dipstick: NEGATIVE
Ketones, ur: 15 mg/dL — AB
Leukocytes,Ua: NEGATIVE
Nitrite: NEGATIVE
Protein, ur: 30 mg/dL — AB
Specific Gravity, Urine: 1.025 (ref 1.005–1.030)
pH: 8.5 — ABNORMAL HIGH (ref 5.0–8.0)

## 2023-06-10 LAB — CBC WITH DIFFERENTIAL/PLATELET
Abs Immature Granulocytes: 0.01 10*3/uL (ref 0.00–0.07)
Basophils Absolute: 0 10*3/uL (ref 0.0–0.1)
Basophils Relative: 1 %
Eosinophils Absolute: 0.1 10*3/uL (ref 0.0–0.5)
Eosinophils Relative: 2 %
HCT: 38.4 % (ref 36.0–46.0)
Hemoglobin: 13.1 g/dL (ref 12.0–15.0)
Immature Granulocytes: 0 %
Lymphocytes Relative: 29 %
Lymphs Abs: 1.5 10*3/uL (ref 0.7–4.0)
MCH: 30.3 pg (ref 26.0–34.0)
MCHC: 34.1 g/dL (ref 30.0–36.0)
MCV: 88.7 fL (ref 80.0–100.0)
Monocytes Absolute: 0.7 10*3/uL (ref 0.1–1.0)
Monocytes Relative: 13 %
Neutro Abs: 2.9 10*3/uL (ref 1.7–7.7)
Neutrophils Relative %: 55 %
Platelets: 366 10*3/uL (ref 150–400)
RBC: 4.33 MIL/uL (ref 3.87–5.11)
RDW: 12.8 % (ref 11.5–15.5)
WBC: 5.2 10*3/uL (ref 4.0–10.5)
nRBC: 0 % (ref 0.0–0.2)

## 2023-06-10 LAB — TROPONIN I (HIGH SENSITIVITY): Troponin I (High Sensitivity): <2 ng/L (ref <18)

## 2023-06-10 LAB — PREGNANCY, URINE: Preg Test, Ur: NEGATIVE

## 2023-06-10 MED ORDER — ACETAMINOPHEN 500 MG PO TABS
1000.0000 mg | ORAL_TABLET | Freq: Once | ORAL | Status: AC
  Administered 2023-06-10: 1000 mg via ORAL
  Filled 2023-06-10: qty 2

## 2023-06-10 MED ORDER — IOHEXOL 300 MG/ML  SOLN
100.0000 mL | Freq: Once | INTRAMUSCULAR | Status: AC | PRN
  Administered 2023-06-10: 100 mL via INTRAVENOUS

## 2023-06-10 MED ORDER — FAMOTIDINE 20 MG PO TABS: 20.0000 mg | ORAL_TABLET | Freq: Two times a day (BID) | ORAL | 0 refills | Status: DC

## 2023-06-10 MED ORDER — FAMOTIDINE 20 MG PO TABS
20.0000 mg | ORAL_TABLET | Freq: Once | ORAL | Status: AC
  Administered 2023-06-10: 20 mg via ORAL
  Filled 2023-06-10: qty 1

## 2023-06-10 MED ORDER — ONDANSETRON HCL 4 MG/2ML IJ SOLN
4.0000 mg | Freq: Once | INTRAMUSCULAR | Status: AC
  Administered 2023-06-10: 4 mg via INTRAVENOUS
  Filled 2023-06-10: qty 2

## 2023-06-10 NOTE — ED Triage Notes (Signed)
Pt states lbm almost a week, had hamburger helper on Monday (that was only thing ate ),next day severe cramps, has been constipated, the patient has taken miralax ,one cup. Pt took 2 immodium on Friday (she didn't know it was for diarrhea),has taken mag citrate. And still no bm.

## 2023-06-10 NOTE — ED Provider Notes (Signed)
[place] EMERGENCY DEPARTMENT AT [facility] Provider Note   CSN: [id number] Arrival date & time: 06/10/23  1110     History  Chief Complaint  Patient presents with   Chest Pain    Gloria Proctor is a 27 y.o. female.  HPI 26 year old female history of anxiety, depression, migraines presenting for multiple concerns.  Patient states since Monday she has had abdominal cramping.  Mostly epigastric but throughout her abdomen.  She is also been constipated had not had a bowel movement for several days.  She took Imodium on Friday, MiraLAX, and took magnesium citrate Friday and Saturday and had a small loose bowel with this morning.  She tried rectal disimpaction and did not have a rectal stool ball.  Still has some pain.  She is tolerating some p.o.  No nausea or vomiting.  No fevers or chills or urinary symptoms.  No vaginal bleeding or discharge.  She is having some occasional chest discomfort which she attributes to her abdominal pain.  No history of GERD or ACS.     Home Medications Prior to Admission medications   Medication Sig Start Date End Date Taking? Authorizing Provider  famotidine (PEPCID) 20 MG tablet Take 1 tablet (20 mg total) by mouth 2 (two) times daily. 06/10/23  Yes Laurence Spates, MD  dextromethorphan-guaiFENesin [facility] DM) 30-600 MG 12hr tablet Take 1 tablet by mouth 2 (two) times daily. 07/17/22   Ellsworth Lennox, PA-C  dicyclomine (BENTYL) 20 MG tablet Take one tab TID PRN bowel cramp/spasm 09/12/21   Crain, Whitney L, PA  fluticasone (FLONASE) 50 MCG/ACT nasal spray Place 1-2 sprays into both nostrils daily for 7 days. 05/16/20 05/23/20  Wieters, Hallie C, PA-C  metroNIDAZOLE (FLAGYL) 500 MG tablet Take 1 tablet (500 mg total) by mouth 2 (two) times daily. 01/25/22   Lamptey, Britta Mccreedy, MD  ondansetron (ZOFRAN) 4 MG tablet Take 1 tablet (4 mg total) by mouth every 8 (eight) hours as needed for nausea or vomiting. 07/17/22   Ellsworth Lennox, PA-C  tiZANidine  (ZANAFLEX) 4 MG tablet Take 1 tablet (4 mg total) by mouth every 6 (six) hours as needed for muscle spasms. 09/12/21   Guy Sandifer L, PA      Allergies    Caffeine and Chocolate    Review of Systems   Review of Systems Review of systems completed and notable as per HPI.  ROS otherwise negative.   Physical Exam Updated Vital Signs BP 120/86   Pulse (!) 59   Temp 98.5 F (36.9 C) (Oral)   Resp 17   Wt 124.7 kg   SpO2 100%   BMI 47.95 kg/m  Physical Exam Vitals and nursing note reviewed.  Constitutional:      General: She is not in acute distress.    Appearance: She is well-developed.  HENT:     Head: Normocephalic and atraumatic.  Eyes:     Conjunctiva/sclera: Conjunctivae normal.  Cardiovascular:     Rate and Rhythm: Normal rate and regular rhythm.     Pulses: Normal pulses.     Heart sounds: Normal heart sounds. No murmur heard. Pulmonary:     Effort: Pulmonary effort is normal. No respiratory distress.     Breath sounds: Normal breath sounds.  Abdominal:     Palpations: Abdomen is soft.     Tenderness: There is abdominal tenderness.     Comments: Mild tenderness worse in the epigastrium  Musculoskeletal:        General: No swelling.  Cervical back: Neck supple.     Right lower leg: No edema.     Left lower leg: No edema.  Skin:    General: Skin is warm and dry.     Capillary Refill: Capillary refill takes less than 2 seconds.  Neurological:     Mental Status: She is alert.  Psychiatric:        Mood and Affect: Mood normal.     ED Results / Procedures / Treatments   Labs (all labs ordered are listed, but only abnormal results are displayed) Labs Reviewed  COMPREHENSIVE METABOLIC PANEL - Abnormal; Notable for the following components:      Result Value   Calcium 8.6 (*)    All other components within normal limits  URINALYSIS, ROUTINE W REFLEX MICROSCOPIC - Abnormal; Notable for the following components:   APPearance HAZY (*)    pH 8.5 (*)     Ketones, ur 15 (*)    Protein, ur 30 (*)    Bacteria, UA RARE (*)    All other components within normal limits  LIPASE, BLOOD  CBC WITH DIFFERENTIAL/PLATELET  PREGNANCY, URINE  TROPONIN I (HIGH SENSITIVITY)    EKG EKG Interpretation Date/Time:  Sunday June 10 2023 12:08:43 EDT Ventricular Rate:  63 PR Interval:  150 QRS Duration:  97 QT Interval:  412 QTC Calculation: 422 R Axis:   80  Text Interpretation: Sinus rhythm Borderline T abnormalities, anterior leads Confirmed by Fulton Reek [phone]) on 06/10/2023 12:15:05 PM  Radiology CT ABDOMEN PELVIS W CONTRAST  Result Date: 06/10/2023 CLINICAL DATA:  Acute abdominal pain since yesterday. EXAM: CT ABDOMEN AND PELVIS WITH CONTRAST TECHNIQUE: Multidetector CT imaging of the abdomen and pelvis was performed using the standard protocol following bolus administration of intravenous contrast. RADIATION DOSE REDUCTION: This exam was performed according to the departmental dose-optimization program which includes automated exposure control, adjustment of the mA and/or kV according to patient size and/or use of iterative reconstruction technique. CONTRAST:  OMNIPAQUE IOHEXOL 300 MG/ML  SOLN COMPARISON:  11/21/2020 FINDINGS: Lower chest: The lung bases are clear of acute process. No pleural effusion or pulmonary lesions. The heart is normal in size. No pericardial effusion. The distal esophagus and aorta are unremarkable. Hepatobiliary: No focal liver abnormality is seen. No gallstones, gallbladder wall thickening, or biliary dilatation. Pancreas: Unremarkable. No pancreatic ductal dilatation or surrounding inflammatory changes. Spleen: Normal in size without focal abnormality. Adrenals/Urinary Tract: Adrenal glands are unremarkable. Kidneys are normal, without renal calculi, focal lesion, or hydronephrosis. Bladder is unremarkable. Stomach/Bowel: The stomach, duodenum, small bowel and colon are grossly normal without oral contrast. No  inflammatory changes, mass lesions or obstructive findings. The appendix is normal. Vascular/Lymphatic: The aorta is normal in caliber. No dissection. The branch vessels are patent. The major venous structures are patent. No mesenteric or retroperitoneal mass or adenopathy. Small scattered lymph nodes are noted. Reproductive: The uterus and ovaries are unremarkable. Other: No pelvic mass or adenopathy. No free pelvic fluid collections. No inguinal mass or adenopathy. No abdominal wall hernia or subcutaneous lesions. Musculoskeletal: No significant bony findings. IMPRESSION: No acute abdominal/pelvic findings, mass lesions or adenopathy. Electronically Signed   By: Rudie Meyer M.D.   On: 06/10/2023 14:10   DG Chest 2 View  Result Date: 06/10/2023 CLINICAL DATA:  Chest pain. EXAM: CHEST - 2 VIEW COMPARISON:  None Available. FINDINGS: The heart size and mediastinal contours are within normal limits. Both lungs are clear. The visualized skeletal structures are unremarkable. IMPRESSION: No active cardiopulmonary  disease. Electronically Signed   By: Obie Dredge M.D.   On: 06/10/2023 14:07    Procedures Procedures    Medications Ordered in ED Medications  ondansetron (ZOFRAN) injection 4 mg (4 mg Intravenous Given 06/10/23 1215)  acetaminophen (TYLENOL) tablet 1,000 mg (1,000 mg Oral Given 06/10/23 1216)  famotidine (PEPCID) tablet 20 mg (20 mg Oral Given 06/10/23 1216)  iohexol (OMNIPAQUE) 300 MG/ML solution 100 mL (100 mLs Intravenous Contrast Given 06/10/23 1321)    ED Course/ Medical Decision Making/ A&P                                  Medical Decision Making Amount and/or Complexity of Data Reviewed Labs: ordered. Radiology: ordered.  Risk OTC drugs. Prescription drug management.   Medical Decision Making:   Gloria Proctor is a 27 y.o. female who presented to the ED today with abdominal pain, chest pain, constipation.  Vital signs reviewed.  Exam she is well-appearing for  mild tenderness worse in the epigastrium.  Suspect possible gastritis, I also think constipation is likely contributing.  However given persistence despite home treatments will obtain CT scan to rule out obstruction, biliary pathology or other acute intra-abdominal pathology.  Has some mild chest pain.  Obtain EKG and troponin although low concern for ACS.  Low suspicion for dissection, PE.   Patient placed on continuous vitals and telemetry monitoring while in ED which was reviewed periodically.  Reviewed and confirmed nursing documentation for past medical history, family history, social history.  Reassessment and Plan:   On reassessment patient is pain-free.  Tolerant p.o.  No chest pain or abdominal pain.  Pepcid seem to help her pain, water she has some gastritis or GERD.  CT scan and lab work are reassuring.  Recommend PCP follow-up.    Patient's presentation is most consistent with acute complicated illness / injury requiring diagnostic workup.           Final Clinical Impression(s) / ED Diagnoses Final diagnoses:  Generalized abdominal pain    Rx / DC Orders ED Discharge Orders          Ordered    famotidine (PEPCID) 20 MG tablet  2 times daily        06/10/23 1433              Laurence Spates, MD 06/10/23 1904

## 2023-06-10 NOTE — Discharge Instructions (Addendum)
You were seen today for abdominal pain.  Your CT scan and lab work were reassuring.  I suspect your pain could be due to gastritis or inflammation of your stomach.  You are being started on Pepcid.  I recommend you follow-up with your primary care provider.  If you develop severe pain or any other new concerning symptoms you should return to the ED.

## 2023-06-10 NOTE — ED Triage Notes (Signed)
Chest pain started yesterday. Feels heavy,like a muscle, in center of chest.hurts when straining or when her abdomen hurts.

## 2024-09-03 ENCOUNTER — Ambulatory Visit (HOSPITAL_COMMUNITY)
Admission: EM | Admit: 2024-09-03 | Discharge: 2024-09-03 | Disposition: A | Discharge status: Home / Self Care | Attending: Family Medicine | Admitting: Family Medicine

## 2024-09-03 ENCOUNTER — Encounter (HOSPITAL_COMMUNITY): Payer: Self-pay

## 2024-09-03 DIAGNOSIS — R051 Acute cough: Secondary | ICD-10-CM | POA: Diagnosis not present

## 2024-09-03 DIAGNOSIS — J069 Acute upper respiratory infection, unspecified: Secondary | ICD-10-CM

## 2024-09-03 DIAGNOSIS — R0981 Nasal congestion: Secondary | ICD-10-CM | POA: Diagnosis not present

## 2024-09-03 LAB — POCT INFLUENZA A/B
Influenza A, POC: NEGATIVE
Influenza B, POC: NEGATIVE

## 2024-09-03 LAB — POC SOFIA SARS ANTIGEN FIA: SARS Coronavirus 2 Ag: NEGATIVE

## 2024-09-03 MED ORDER — BENZONATATE 100 MG PO CAPS: ORAL_CAPSULE | ORAL | 0 refills | Status: AC

## 2024-09-03 NOTE — Discharge Instructions (Signed)
 You may use over the counter AFRIN nasal spray for nasal congestion. This medication is for use in the nose. Take it as directed on the label. Shake well before using. Do not use it more often than directed. Do not use for more than 3 days in a row without talking to your care team first. Make sure that you are using your nasal spray correctly.

## 2024-09-03 NOTE — ED Provider Notes (Signed)
 " [facility]   [id number] 09/03/24 Arrival Time: 1305  ASSESSMENT & PLAN:  1. Acute cough   2. Viral URI with cough   3. Nasal congestion     Discussed typical duration of likely viral illness. Results for orders placed or performed during the hospital encounter of 09/03/24  POC SARS Coronavirus Ag   Collection Time: 09/03/24  2:39 PM  Result Value Ref Range   SARS Coronavirus 2 Ag Negative Negative  POC Influenza A/B   Collection Time: 09/03/24  2:39 PM  Result Value Ref Range   Influenza A, POC Negative Negative   Influenza B, POC Negative Negative   OTC symptom care as needed.  Meds ordered this encounter  Medications   benzonatate  (TESSALON ) 100 MG capsule    Sig: Take 1 capsule by mouth every 8 (eight) hours for cough.    Dispense:  21 capsule    Refill:  0     Discharge Instructions      You may use over the counter AFRIN nasal spray for nasal congestion. This medication is for use in the nose. Take it as directed on the label. Shake well before using. Do not use it more often than directed. Do not use for more than 3 days in a row without talking to your care team first. Make sure that you are using your nasal spray correctly.        Follow-up Information     [facility].   Specialty: Urgent Care Why: If worsening or failing to improve as anticipated. Contact information: [address] [place]  [address] [phone]                Reviewed expectations re: course of current medical issues. Questions answered. Outlined signs and symptoms indicating need for more acute intervention. Understanding verbalized. After Visit Summary given.   SUBJECTIVE: History from: Patient. Gloria Proctor is a 29 y.o. female. Pt c/o cough, congestion, chills, and body aches since yesterday. Took Nyquil last night with little relief. States had same sx's on 12/23 last 2wks. States was never seen that  time. Denies: fever. Normal PO intake without n/v/d.  OBJECTIVE:  Vitals:   09/03/24 1409  BP: 134/86  Pulse: 79  Resp: 18  Temp: 99.3 F (37.4 C)  TempSrc: Oral  SpO2: 96%    General appearance: alert; no distress Eyes: PERRLA; EOMI; conjunctiva normal HENT: [place]; AT; with nasal congestion Neck: supple  Lungs: speaks full sentences without difficulty; unlabored; dry cough Extremities: no edema Skin: warm and dry Neurologic: normal gait Psychological: alert and cooperative; normal mood and affect  Labs: Results for orders placed or performed during the hospital encounter of 09/03/24  POC SARS Coronavirus Ag   Collection Time: 09/03/24  2:39 PM  Result Value Ref Range   SARS Coronavirus 2 Ag Negative Negative  POC Influenza A/B   Collection Time: 09/03/24  2:39 PM  Result Value Ref Range   Influenza A, POC Negative Negative   Influenza B, POC Negative Negative   Labs Reviewed  POC SOFIA SARS ANTIGEN FIA  POCT INFLUENZA A/B    Imaging: No results found.  Allergies[1]  Past Medical History:  Diagnosis Date   Anxiety    Depression    Migraines    Social History   Socioeconomic History   Marital status: Single    Spouse name: Not on file   Number of children: Not on file   Years of  education: Not on file   Highest education level: Not on file  Occupational History   Not on file  Tobacco Use   Smoking status: Every Day    Types: Cigarettes    Start date: 03/28/2014   Smokeless tobacco: Never   Tobacco comments:    quit 3 wks ago  Vaping Use   Vaping status: Never Used  Substance and Sexual Activity   Alcohol use: Yes   Drug use: No   Sexual activity: Yes    Birth control/protection: Condom  Other Topics Concern   Not on file  Social History Narrative   Not on file   Social Drivers of Health   Tobacco Use: High Risk (09/03/2024)   Patient History    Smoking Tobacco Use: Every Day    Smokeless Tobacco Use: Never    Passive Exposure: Not on  file  Financial Resource Strain: Not on file  Food Insecurity: Not on file  Transportation Needs: Not on file  Physical Activity: Not on file  Stress: Not on file  Social Connections: Not on file  Intimate Partner Violence: Not on file  Depression (EYV7-0): Not on file  Alcohol Screen: Not on file  Housing: Not on file  Utilities: Not on file  Health Literacy: Not on file   Family History  Problem Relation Age of Onset   Migraines Mother        Started having migraines during adulthood, after child birth   Heart attack Paternal Grandfather    Past Surgical History:  Procedure Laterality Date   MULTIPLE EXTRACTIONS WITH ALVEOLOPLASTY N/A 06/04/2015   Procedure: REMOVAL OF WISDOM TEETH;  Surgeon: Glendia Primrose, DDS;  Location: [facility];  Service: Oral Surgery;  Laterality: N/A;   TONSILLECTOMY     TONSILLECTOMY AND ADENOIDECTOMY N/A 08/16/2013   Procedure: TONSILLECTOMY AND ADENOIDECTOMY;  Surgeon: Marlyce Finer, MD;  Location: [facility];  Service: ENT;  Laterality: N/A;      [1]  Allergies Allergen Reactions   Caffeine Other (See Comments)    Patient has migraines    Chocolate Other (See Comments)    Reaction:migraines     Rolinda Rogue, MD 09/03/24 1519  "

## 2024-09-03 NOTE — ED Triage Notes (Signed)
 Pt c/o cough, congestion, chills, and body aches since yesterday. Took Nyquil last night with little relief. States had same sx's on 12/23 last 2wks. States was never seen that time.
